# Patient Record
Sex: Male | Born: 1985 | Race: White | Hispanic: No | Marital: Single | Smoking: Former smoker
Health system: Southern US, Community
[De-identification: ages and names within clinical notes are randomized; demographics above are authoritative.]

## PROBLEM LIST (undated history)

## (undated) DIAGNOSIS — I10 Essential (primary) hypertension: Secondary | ICD-10-CM

## (undated) HISTORY — PX: HERNIA REPAIR: SHX51

## (undated) HISTORY — PX: SMALL INTESTINE SURGERY: SHX150

---

## 2005-04-23 ENCOUNTER — Ambulatory Visit: Payer: Self-pay | Admitting: Family Medicine

## 2013-07-06 ENCOUNTER — Telehealth: Payer: Self-pay | Admitting: Internal Medicine

## 2013-07-06 NOTE — Telephone Encounter (Signed)
error 

## 2013-07-25 ENCOUNTER — Ambulatory Visit (INDEPENDENT_AMBULATORY_CARE_PROVIDER_SITE_OTHER): Payer: 59 | Admitting: Internal Medicine

## 2013-07-25 ENCOUNTER — Encounter (INDEPENDENT_AMBULATORY_CARE_PROVIDER_SITE_OTHER): Payer: Self-pay

## 2013-07-25 ENCOUNTER — Encounter: Payer: Self-pay | Admitting: Internal Medicine

## 2013-07-25 VITALS — BP 110/80 | HR 86 | Temp 98.5°F | Ht 66.0 in | Wt 187.0 lb

## 2013-07-25 DIAGNOSIS — Z9889 Other specified postprocedural states: Secondary | ICD-10-CM

## 2013-07-25 DIAGNOSIS — E559 Vitamin D deficiency, unspecified: Secondary | ICD-10-CM

## 2013-07-25 DIAGNOSIS — E785 Hyperlipidemia, unspecified: Secondary | ICD-10-CM

## 2013-07-25 DIAGNOSIS — F900 Attention-deficit hyperactivity disorder, predominantly inattentive type: Secondary | ICD-10-CM

## 2013-07-25 DIAGNOSIS — R5381 Other malaise: Secondary | ICD-10-CM

## 2013-07-25 DIAGNOSIS — F411 Generalized anxiety disorder: Secondary | ICD-10-CM

## 2013-07-25 DIAGNOSIS — E669 Obesity, unspecified: Secondary | ICD-10-CM

## 2013-07-25 DIAGNOSIS — F988 Other specified behavioral and emotional disorders with onset usually occurring in childhood and adolescence: Secondary | ICD-10-CM
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
## 2013-07-26 ENCOUNTER — Other Ambulatory Visit (INDEPENDENT_AMBULATORY_CARE_PROVIDER_SITE_OTHER): Payer: 59

## 2013-07-26 DIAGNOSIS — R5381 Other malaise: Secondary | ICD-10-CM

## 2013-07-26 DIAGNOSIS — E559 Vitamin D deficiency, unspecified: Secondary | ICD-10-CM

## 2013-07-26 DIAGNOSIS — Z9889 Other specified postprocedural states: Secondary | ICD-10-CM

## 2013-07-26 DIAGNOSIS — F411 Generalized anxiety disorder: Secondary | ICD-10-CM | POA: Insufficient documentation

## 2013-07-26 DIAGNOSIS — E66811 Obesity, class 1: Secondary | ICD-10-CM | POA: Insufficient documentation

## 2013-07-26 DIAGNOSIS — F900 Attention-deficit hyperactivity disorder, predominantly inattentive type: Secondary | ICD-10-CM | POA: Insufficient documentation

## 2013-07-26 DIAGNOSIS — E669 Obesity, unspecified: Secondary | ICD-10-CM | POA: Insufficient documentation

## 2013-07-26 DIAGNOSIS — E785 Hyperlipidemia, unspecified: Secondary | ICD-10-CM

## 2013-07-26 LAB — LIPID PANEL
Cholesterol: 212 mg/dL — ABNORMAL HIGH (ref 0–200)
HDL: 45.1 mg/dL (ref >39.00)
Total CHOL/HDL Ratio: 5
VLDL: 44.4 mg/dL — ABNORMAL HIGH (ref 0.0–40.0)

## 2013-07-26 LAB — CBC WITH DIFFERENTIAL/PLATELET
Basophils Relative: 0.8 % (ref 0.0–3.0)
Eosinophils Absolute: 0.1 10*3/uL (ref 0.0–0.7)
Eosinophils Relative: 1.8 % (ref 0.0–5.0)
HCT: 42.6 % (ref 39.0–52.0)
Hemoglobin: 14.8 g/dL (ref 13.0–17.0)
Lymphs Abs: 2.1 10*3/uL (ref 0.7–4.0)
MCHC: 34.7 g/dL (ref 30.0–36.0)
MCV: 89.4 fl (ref 78.0–100.0)
Monocytes Absolute: 0.8 10*3/uL (ref 0.1–1.0)
Monocytes Relative: 10.6 % (ref 3.0–12.0)
Neutro Abs: 4.8 10*3/uL (ref 1.4–7.7)
Platelets: 291 10*3/uL (ref 150.0–400.0)
WBC: 8 10*3/uL (ref 4.5–10.5)

## 2013-07-26 LAB — VITAMIN B12: Vitamin B-12: 500 pg/mL (ref 211–911)

## 2013-07-26 LAB — COMPREHENSIVE METABOLIC PANEL
AST: 37 U/L (ref 0–37)
Albumin: 4.7 g/dL (ref 3.5–5.2)
Alkaline Phosphatase: 72 U/L (ref 39–117)
Creatinine, Ser: 1 mg/dL (ref 0.4–1.5)
GFR: 97.15 mL/min (ref >60.00)
Potassium: 4.7 mEq/L (ref 3.5–5.1)
Sodium: 141 mEq/L (ref 135–145)
Total Bilirubin: 0.9 mg/dL (ref 0.3–1.2)
Total Protein: 7.9 g/dL (ref 6.0–8.3)

## 2013-07-26 LAB — LDL CHOLESTEROL, DIRECT: Direct LDL: 148 mg/dL

## 2013-07-26 NOTE — Assessment & Plan Note (Signed)
Managed with prozac and clonazepan.

## 2013-07-26 NOTE — Assessment & Plan Note (Signed)
Managed with adderall long acting and shot acting.  He is functioning well on current therapy. No changes today

## 2013-07-26 NOTE — Assessment & Plan Note (Signed)
I have addressed  BMI and recommended a low glycemic index diet utilizing smaller more frequent meals to increase metabolism.  I have also recommended that patient start exercising with a goal of 30 minutes of aerobic exercise a minimum of 5 days per week. Screening for lipid disorders, thyroid and diabetes to be done   

## 2013-08-30 ENCOUNTER — Ambulatory Visit: Payer: 59 | Admitting: Internal Medicine

## 2013-09-25 ENCOUNTER — Encounter: Payer: Self-pay | Admitting: Internal Medicine

## 2013-09-25 ENCOUNTER — Ambulatory Visit (INDEPENDENT_AMBULATORY_CARE_PROVIDER_SITE_OTHER): Payer: 59 | Admitting: Internal Medicine

## 2013-09-25 VITALS — BP 118/78 | Temp 98.8°F | Wt 193.2 lb

## 2013-09-25 DIAGNOSIS — F411 Generalized anxiety disorder: Secondary | ICD-10-CM

## 2013-09-25 DIAGNOSIS — E669 Obesity, unspecified: Secondary | ICD-10-CM

## 2013-09-25 DIAGNOSIS — F988 Other specified behavioral and emotional disorders with onset usually occurring in childhood and adolescence: Secondary | ICD-10-CM

## 2013-09-25 DIAGNOSIS — Z8719 Personal history of other diseases of the digestive system: Secondary | ICD-10-CM

## 2013-09-25 DIAGNOSIS — F900 Attention-deficit hyperactivity disorder, predominantly inattentive type: Secondary | ICD-10-CM

## 2013-09-25 MED ORDER — AMPHETAMINE-DEXTROAMPHET ER 20 MG PO CP24: 20.0000 mg | ORAL_CAPSULE | Freq: Every day | ORAL | Status: DC

## 2013-09-25 MED ORDER — AMPHETAMINE-DEXTROAMPHETAMINE 10 MG PO TABS: 10.0000 mg | ORAL_TABLET | Freq: Two times a day (BID) | ORAL | Status: DC

## 2013-09-25 NOTE — Progress Notes (Signed)
Pre-visit discussion using our clinic review tool. No additional management support is needed unless otherwise documented below in the visit note.  
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
## 2013-09-26 DIAGNOSIS — Z8719 Personal history of other diseases of the digestive system: Secondary | ICD-10-CM | POA: Insufficient documentation

## 2013-09-26 NOTE — Assessment & Plan Note (Signed)
Well controlled on current regimen, no changes today. 

## 2013-09-26 NOTE — Assessment & Plan Note (Signed)
I have addressed  BMI and recommended wt loss of 10% of body weight over the next 6 months using a low glycemic index diet and regular exercise a minimum of 5 days per week.   

## 2013-09-26 NOTE — Assessment & Plan Note (Addendum)
Well controlled on current regimen. Liver and kidney Renal function stable, no changes today. Refills for 3 months given today.
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
## 2014-01-01 ENCOUNTER — Encounter: Payer: Self-pay | Admitting: Internal Medicine

## 2014-01-01 ENCOUNTER — Ambulatory Visit (INDEPENDENT_AMBULATORY_CARE_PROVIDER_SITE_OTHER): Payer: 59 | Admitting: Internal Medicine

## 2014-01-01 VITALS — BP 108/70 | HR 86 | Temp 98.4°F | Resp 16 | Wt 186.5 lb

## 2014-01-01 DIAGNOSIS — F988 Other specified behavioral and emotional disorders with onset usually occurring in childhood and adolescence: Secondary | ICD-10-CM

## 2014-01-01 DIAGNOSIS — F411 Generalized anxiety disorder: Secondary | ICD-10-CM

## 2014-01-01 DIAGNOSIS — E785 Hyperlipidemia, unspecified: Secondary | ICD-10-CM

## 2014-01-01 DIAGNOSIS — Z79899 Other long term (current) drug therapy: Secondary | ICD-10-CM

## 2014-01-01 DIAGNOSIS — E782 Mixed hyperlipidemia: Secondary | ICD-10-CM

## 2014-01-01 DIAGNOSIS — E669 Obesity, unspecified: Secondary | ICD-10-CM

## 2014-01-01 DIAGNOSIS — F900 Attention-deficit hyperactivity disorder, predominantly inattentive type: Secondary | ICD-10-CM

## 2014-01-01 MED ORDER — AMPHETAMINE-DEXTROAMPHET ER 20 MG PO CP24: 20.0000 mg | ORAL_CAPSULE | Freq: Every day | ORAL | Status: DC

## 2014-01-01 MED ORDER — AMPHETAMINE-DEXTROAMPHETAMINE 10 MG PO TABS: 10.0000 mg | ORAL_TABLET | Freq: Two times a day (BID) | ORAL | Status: DC

## 2014-01-01 NOTE — Progress Notes (Signed)
Pre-visit discussion using our clinic review tool. No additional management support is needed unless otherwise documented below in the visit note.  

## 2014-01-01 NOTE — Patient Instructions (Signed)
Congratulations on the weight loss  Return in 6 months for follow up

## 2014-01-01 NOTE — Progress Notes (Signed)
Patient ID: Christopher Terrell, male   DOB: 1986-03-25, 28 y.o.   MRN: [id number]  Patient Active Problem List   Diagnosis Date Noted  . Elevated triglycerides with high cholesterol 01/02/2014  . History of hiatal hernia 09/26/2013  . ADD (attention deficit hyperactivity disorder, inattentive type) 07/26/2013  . Generalized anxiety disorder 07/26/2013  . Obesity, unspecified 07/26/2013    Subjective:  CC:   Chief Complaint  Patient presents with  . Follow-up    medications    HPI:   Christopher Terrell is a 28 y.o. male who presents for 3 month Follow up on ADD,  Overweight,  Hypertriglyceridemia.   He has  lost 7 lbs,  By paying more attention to diet.  Some occasional exercise but not regular.    ADD well controlled on Adderal 20 and 10 daily .  NO escalation of meds,  Not combining with alcohol.  Performing well at work.   Uses prn clonazepam ODT sparingly for night terros brought on by major lie changes.  Occurring 3/year     No past medical history on file.  Past Surgical History  Procedure Laterality Date  . Small intestine surgery    . Hernia repair         The following portions of the patient's history were reviewed and updated as appropriate: Allergies, current medications, and problem list.    Review of Systems:   Patient denies headache, fevers, malaise, unintentional weight loss, skin rash, eye pain, sinus congestion and sinus pain, sore throat, dysphagia,  hemoptysis , cough, dyspnea, wheezing, chest pain, palpitations, orthopnea, edema, abdominal pain, nausea, melena, diarrhea, constipation, flank pain, dysuria, hematuria, urinary  Frequency, nocturia, numbness, tingling, seizures,  Focal weakness, Loss of consciousness,  Tremor, insomnia, depression, anxiety, and suicidal ideation.     History   Social History  . Marital Status: Single    Spouse Name: N/A    Number of Children: N/A  . Years of Education: N/A   Occupational History  . Not on file.    Social History Main Topics  . Smoking status: Former Smoker    Quit date: 09/22/2011  . Smokeless tobacco: Never Used  . Alcohol Use: Yes  . Drug Use: No  . Sexual Activity: Not on file   Other Topics Concern  . Not on file   Social History Narrative  . No narrative on file    Objective:  Filed Vitals:   01/01/14 1703  BP: 108/70  Pulse: 86  Temp: 98.4 F (36.9 C)  Resp: 16     General appearance: alert, cooperative and appears stated age Ears: normal TM's and external ear canals both ears Throat: lips, mucosa, and tongue normal; teeth and gums normal Neck: no adenopathy, no carotid bruit, supple, symmetrical, trachea midline and thyroid not enlarged, symmetric, no tenderness/mass/nodules Back: symmetric, no curvature. ROM normal. No CVA tenderness. Lungs: clear to auscultation bilaterally Heart: regular rate and rhythm, S1, S2 normal, no murmur, click, rub or gallop Abdomen: soft, non-tender; bowel sounds normal; no masses,  no organomegaly Pulses: 2+ and symmetric Skin: Skin color, texture, turgor normal. No rashes or lesions Lymph nodes: Cervical, supraclavicular, and axillary nodes normal.  Assessment and Plan:  ADD (attention deficit hyperactivity disorder, inattentive type) Well controlled on current regimen. Liver and kidney Renal function stable, no changes today. Refills for 3 months given today.     Generalized anxiety disorder Well controlled on current regimen. , no changes today.    Obesity, unspecified I have congratulated him  in reduction of   BMI and encouraged  Continued weight loss with goal of 10% of body weight over the next 6 months using a low glycemic index diet and regular exercise a minimum of 5 days per week.     Updated Medication List Outpatient Encounter Prescriptions as of 01/01/2014  Medication Sig  . amphetamine-dextroamphetamine (ADDERALL XR) 20 MG 24 hr capsule Take 1 capsule (20 mg total) by mouth daily.  Marland Kitchen  amphetamine-dextroamphetamine (ADDERALL XR) 20 MG 24 hr capsule Take 1 capsule (20 mg total) by mouth daily.  Marland Kitchen amphetamine-dextroamphetamine (ADDERALL) 10 MG tablet Take 1 tablet (10 mg total) by mouth 2 (two) times daily with a meal.  . clonazePAM (KLONOPIN) 0.25 MG disintegrating tablet Take 0.25 mg by mouth daily.  . propranolol (INDERAL) 10 MG tablet Take 0.5 mg by mouth 3 (three) times daily as needed.  . [DISCONTINUED] amphetamine-dextroamphetamine (ADDERALL XR) 20 MG 24 hr capsule Take 1 capsule (20 mg total) by mouth daily.  . [DISCONTINUED] amphetamine-dextroamphetamine (ADDERALL XR) 20 MG 24 hr capsule Take 1 capsule (20 mg total) by mouth daily.  . [DISCONTINUED] amphetamine-dextroamphetamine (ADDERALL XR) 20 MG 24 hr capsule Take 1 capsule (20 mg total) by mouth daily.  . [DISCONTINUED] amphetamine-dextroamphetamine (ADDERALL) 10 MG tablet Take 1 tablet (10 mg total) by mouth 2 (two) times daily with a meal.  . [DISCONTINUED] amphetamine-dextroamphetamine (ADDERALL) 10 MG tablet Take 1 tablet (10 mg total) by mouth 2 (two) times daily with a meal.  . [DISCONTINUED] amphetamine-dextroamphetamine (ADDERALL) 10 MG tablet Take 1 tablet (10 mg total) by mouth 2 (two) times daily with a meal.  . [DISCONTINUED] FLUoxetine (PROZAC) 10 MG capsule Take 10 mg by mouth 3 (three) times daily as needed (anxiety).     Orders Placed This Encounter  Procedures  . Comprehensive metabolic panel  . Lipid panel    No Follow-up on file.

## 2014-01-02 DIAGNOSIS — E782 Mixed hyperlipidemia: Secondary | ICD-10-CM | POA: Insufficient documentation

## 2014-01-02 NOTE — Assessment & Plan Note (Signed)
I have congratulated him in reduction of   BMI and encouraged  Continued weight loss with goal of 10% of body weight over the next 6 months using a low glycemic index diet and regular exercise a minimum of 5 days per week.

## 2014-01-02 NOTE — Assessment & Plan Note (Signed)
Well controlled on current regimen. Liver and kidney Renal function stable, no changes today. Refills for 3 months given today.

## 2014-01-02 NOTE — Assessment & Plan Note (Signed)
Well controlled on current regimen, no changes today. 

## 2014-03-29 ENCOUNTER — Telehealth: Payer: Self-pay | Admitting: Internal Medicine

## 2014-03-29 DIAGNOSIS — F900 Attention-deficit hyperactivity disorder, predominantly inattentive type: Secondary | ICD-10-CM

## 2014-03-29 MED ORDER — AMPHETAMINE-DEXTROAMPHET ER 20 MG PO CP24: 20.0000 mg | ORAL_CAPSULE | Freq: Every day | ORAL | Status: DC

## 2014-03-29 MED ORDER — AMPHETAMINE-DEXTROAMPHETAMINE 10 MG PO TABS: 10.0000 mg | ORAL_TABLET | Freq: Two times a day (BID) | ORAL | Status: DC

## 2014-03-29 NOTE — Telephone Encounter (Signed)
Patient called for refill on adderall last OV 4/15 please advise.

## 2014-03-29 NOTE — Assessment & Plan Note (Addendum)
Patient will be seen every 6 months for refills.

## 2014-03-29 NOTE — Telephone Encounter (Signed)
Patient called back and stated he needs to have labs done at [facility] .

## 2014-03-29 NOTE — Telephone Encounter (Signed)
Patient coming in for fasting labs. 03/30/14 and Narcotic agreement printed to be signed .

## 2014-03-29 NOTE — Telephone Encounter (Signed)
Needs to sign controlled substance contract.  And needs fastign labs repeated .  Refills not due til July 13 so pls schedule him and appt for  fasting labs visit .  Needs 6 month follow up in Nov

## 2014-03-30 ENCOUNTER — Other Ambulatory Visit: Payer: 59

## 2014-04-02 ENCOUNTER — Other Ambulatory Visit: Payer: Self-pay | Admitting: Internal Medicine

## 2014-04-02 ENCOUNTER — Telehealth: Payer: Self-pay | Admitting: Internal Medicine

## 2014-04-02 MED ORDER — AMPHETAMINE-DEXTROAMPHETAMINE 10 MG PO TABS: 10.0000 mg | ORAL_TABLET | Freq: Two times a day (BID) | ORAL | Status: DC

## 2014-04-02 MED ORDER — AMPHETAMINE-DEXTROAMPHET ER 20 MG PO CP24: 20.0000 mg | ORAL_CAPSULE | Freq: Every day | ORAL | Status: DC

## 2014-04-02 NOTE — Telephone Encounter (Signed)
Patient came in to sign narcotic agreement and pick up lab order and scripts, realized patient needs script for July on Adderall XR, and for August and September on the adderall 10 mg, please advise.

## 2014-04-02 NOTE — Telephone Encounter (Signed)
ok 

## 2014-04-03 NOTE — Telephone Encounter (Signed)
Patient notified scripts ready.

## 2014-04-27 ENCOUNTER — Other Ambulatory Visit: Payer: Self-pay | Admitting: Internal Medicine

## 2014-04-27 LAB — BASIC METABOLIC PANEL
BUN: 19 mg/dL (ref 4–21)
CREATININE: 1.1 mg/dL (ref 0.6–1.3)
Glucose: 102 mg/dL
Potassium: 4.1 mmol/L (ref 3.4–5.3)
Sodium: 138 mmol/L (ref 137–147)

## 2014-04-27 LAB — COMPREHENSIVE METABOLIC PANEL
AST: 25 U/L (ref 15–37)
Albumin: 3.6 g/dL (ref 3.4–5.0)
Alkaline Phosphatase: 78 U/L
Anion Gap: 8 (ref 7–16)
BILIRUBIN TOTAL: 0.6 mg/dL (ref 0.2–1.0)
BUN: 19 mg/dL — ABNORMAL HIGH (ref 7–18)
CALCIUM: 8.7 mg/dL (ref 8.5–10.1)
CREATININE: 1.12 mg/dL (ref 0.60–1.30)
Chloride: 104 mmol/L (ref 98–107)
Co2: 26 mmol/L (ref 21–32)
EGFR (African American): >60
EGFR (Non-African Amer.): >60
GLUCOSE: 102 mg/dL — AB (ref 65–99)
Osmolality: 278 (ref 275–301)
Potassium: 4.1 mmol/L (ref 3.5–5.1)
SGPT (ALT): 40 U/L
SODIUM: 138 mmol/L (ref 136–145)
TOTAL PROTEIN: 7.1 g/dL (ref 6.4–8.2)

## 2014-04-27 LAB — LIPID PANEL
CHOLESTEROL: 182 mg/dL (ref 0–200)
Cholesterol: 182 mg/dL (ref 0–200)
HDL Cholesterol: 38 mg/dL — ABNORMAL LOW (ref 40–60)
HDL: 38 mg/dL (ref 35–70)
LDL CALC: 116 mg/dL
Ldl Cholesterol, Calc: 116 mg/dL — ABNORMAL HIGH (ref 0–100)
Triglycerides: 141 mg/dL (ref 0–200)
Triglycerides: 141 mg/dL (ref 40–160)
VLDL CHOLESTEROL, CALC: 28 mg/dL (ref 5–40)

## 2014-04-27 LAB — HEPATIC FUNCTION PANEL
ALK PHOS: 78 U/L (ref 25–125)
ALT: 40 U/L (ref 10–40)
AST: 25 U/L (ref 14–40)
Bilirubin, Total: 0.6 mg/dL

## 2014-04-30 ENCOUNTER — Encounter: Payer: Self-pay | Admitting: Internal Medicine

## 2014-05-02 ENCOUNTER — Encounter: Payer: Self-pay | Admitting: Internal Medicine

## 2014-05-03 ENCOUNTER — Telehealth: Payer: Self-pay | Admitting: Internal Medicine

## 2014-05-03 NOTE — Telephone Encounter (Signed)
Your cholesterol, liver and kidney function are normal.  You do not need any medication changes. Please plan to repeat the labs in 6 months.    Regards,   Dr. Vandora Jaskulski  

## 2014-05-04 NOTE — Telephone Encounter (Signed)
Left detailed message on pts identified VM 

## 2014-05-11 ENCOUNTER — Encounter: Payer: Self-pay | Admitting: Internal Medicine

## 2014-07-02 ENCOUNTER — Encounter: Payer: Self-pay | Admitting: Internal Medicine

## 2014-07-02 ENCOUNTER — Ambulatory Visit (INDEPENDENT_AMBULATORY_CARE_PROVIDER_SITE_OTHER): Payer: 59 | Admitting: Internal Medicine

## 2014-07-02 VITALS — BP 134/92 | HR 81 | Temp 98.8°F | Resp 16 | Ht 66.0 in | Wt 192.8 lb

## 2014-07-02 DIAGNOSIS — F900 Attention-deficit hyperactivity disorder, predominantly inattentive type: Secondary | ICD-10-CM

## 2014-07-02 DIAGNOSIS — R030 Elevated blood-pressure reading, without diagnosis of hypertension: Secondary | ICD-10-CM

## 2014-07-02 DIAGNOSIS — F411 Generalized anxiety disorder: Secondary | ICD-10-CM

## 2014-07-02 DIAGNOSIS — E669 Obesity, unspecified: Secondary | ICD-10-CM

## 2014-07-02 DIAGNOSIS — E782 Mixed hyperlipidemia: Secondary | ICD-10-CM

## 2014-07-02 MED ORDER — AMPHETAMINE-DEXTROAMPHETAMINE 10 MG PO TABS
10.0000 mg | ORAL_TABLET | Freq: Two times a day (BID) | ORAL | Status: DC
Start: 2014-07-02 — End: 2014-07-02

## 2014-07-02 MED ORDER — CLONAZEPAM 0.25 MG PO TBDP: 0.2500 mg | ORAL_TABLET | Freq: Every day | ORAL | Status: DC | PRN

## 2014-07-02 MED ORDER — AMPHETAMINE-DEXTROAMPHET ER 20 MG PO CP24: 20.0000 mg | ORAL_CAPSULE | Freq: Every day | ORAL | Status: DC

## 2014-07-02 MED ORDER — AMPHETAMINE-DEXTROAMPHETAMINE 10 MG PO TABS: 10.0000 mg | ORAL_TABLET | Freq: Two times a day (BID) | ORAL | Status: DC

## 2014-07-02 MED ORDER — PROPRANOLOL HCL ER 60 MG PO CP24: 60.0000 mg | ORAL_CAPSULE | Freq: Every day | ORAL | Status: DC

## 2014-07-02 MED ORDER — AMPHETAMINE-DEXTROAMPHET ER 20 MG PO CP24
20.0000 mg | ORAL_CAPSULE | Freq: Every day | ORAL | Status: DC
Start: 2014-07-02 — End: 2014-07-02

## 2014-07-02 NOTE — Progress Notes (Signed)
Patient ID: Christopher Terrell, male   DOB: 02/12/1986, 28 y.o.   MRN: [id number]  Patient Active Problem List   Diagnosis Date Noted  . Elevated blood pressure reading without diagnosis of hypertension 07/03/2014  . Elevated triglycerides with high cholesterol 01/02/2014  . History of hiatal hernia 09/26/2013  . ADD (attention deficit hyperactivity disorder, inattentive type) 07/26/2013  . Generalized anxiety disorder 07/26/2013  . Obesity 07/26/2013    Subjective:  CC:   Chief Complaint  Patient presents with  . Follow-up    medication refilled    HPI:   Christopher Terrell is a 28 y.o. male who presents for  6 month follow up on ADD, GAD, hypertriglyceridemia, and obesity. He is functioning well on current regimen of Adderlal XR 20 mg and short acting 10 mg in afternoon .  Using the clonazepam sparingly.  No panic attacks.  Repeat lipids were normal, achieved with dietary modifications and exercise.  Weight has increased but his clothes are fitting better than before and he is physically active with some form of exercise 3-4 days per week.    History reviewed. No pertinent past medical history.  Past Surgical History  Procedure Laterality Date  . Small intestine surgery    . Hernia repair         The following portions of the patient's history were reviewed and updated as appropriate: Allergies, current medications, and problem list.    Review of Systems:   Patient denies headache, fevers, malaise, unintentional weight loss, skin rash, eye pain, sinus congestion and sinus pain, sore throat, dysphagia,  hemoptysis , cough, dyspnea, wheezing, chest pain, palpitations, orthopnea, edema, abdominal pain, nausea, melena, diarrhea, constipation, flank pain, dysuria, hematuria, urinary  Frequency, nocturia, numbness, tingling, seizures,  Focal weakness, Loss of consciousness,  Tremor, insomnia, depression, anxiety, and suicidal ideation.     History   Social History  . Marital  Status: Single    Spouse Name: N/A    Number of Children: N/A  . Years of Education: N/A   Occupational History  . Not on file.   Social History Main Topics  . Smoking status: Former Smoker    Quit date: 09/22/2011  . Smokeless tobacco: Never Used  . Alcohol Use: 4.2 oz/week    7 Cans of beer per week  . Drug Use: No  . Sexual Activity: Not on file   Other Topics Concern  . Not on file   Social History Narrative  . No narrative on file    Objective:  Filed Vitals:   07/02/14 1758  BP: 134/92  Pulse: 81  Temp: 98.8 F (37.1 C)  Resp: 16     General appearance: alert, cooperative and appears stated age Ears: normal TM's and external ear canals both ears Throat: lips, mucosa, and tongue normal; teeth and gums normal Neck: no adenopathy, no carotid bruit, supple, symmetrical, trachea midline and thyroid not enlarged, symmetric, no tenderness/mass/nodules Back: symmetric, no curvature. ROM normal. No CVA tenderness. Lungs: clear to auscultation bilaterally Heart: regular rate and rhythm, S1, S2 normal, no murmur, click, rub or gallop Abdomen: soft, non-tender; bowel sounds normal; no masses,  no organomegaly Pulses: 2+ and symmetric Skin: Skin color, texture, turgor normal. No rashes or lesions Lymph nodes: Cervical, supraclavicular, and axillary nodes normal.  Assessment and Plan:  Elevated triglycerides with high cholesterol Resolved with low GI diet and exercise,  Will repeat annually  Lab Results  Component Value Date   CHOL 182 04/27/2014   HDL  38 04/27/2014   LDLCALC 116 04/27/2014   LDLDIRECT 148.0 07/26/2013   TRIG 141 04/27/2014   CHOLHDL 5 07/26/2013    Obesity I have addressed  BMI and recommended wt loss of 10% of body weigh over the next 6 months using a low glycemic index diet and regular exercise a minimum of 5 days per week.    Generalized anxiety disorder Well controlled on current regimen. , no changes today.      ADD (attention deficit  hyperactivity disorder, inattentive type) Performing well on current regimen of Adderall XR 20 mg qam and adderall 10 mg in afternoon  Elevated blood pressure reading without diagnosis of hypertension Secondary to adderall.  Changing propranolol to LA 60 mg daily    Updated Medication List Outpatient Encounter Prescriptions as of 07/02/2014  Medication Sig  . amphetamine-dextroamphetamine (ADDERALL XR) 20 MG 24 hr capsule Take 1 capsule (20 mg total) by mouth daily.  Marland Kitchen amphetamine-dextroamphetamine (ADDERALL) 10 MG tablet Take 1 tablet (10 mg total) by mouth 2 (two) times daily with a meal.  . clonazePAM (KLONOPIN) 0.25 MG disintegrating tablet Take 1 tablet (0.25 mg total) by mouth daily as needed.  . [DISCONTINUED] amphetamine-dextroamphetamine (ADDERALL XR) 20 MG 24 hr capsule Take 1 capsule (20 mg total) by mouth daily.  . [DISCONTINUED] amphetamine-dextroamphetamine (ADDERALL XR) 20 MG 24 hr capsule Take 1 capsule (20 mg total) by mouth daily.  . [DISCONTINUED] amphetamine-dextroamphetamine (ADDERALL) 10 MG tablet Take 1 tablet (10 mg total) by mouth 2 (two) times daily with a meal.  . [DISCONTINUED] amphetamine-dextroamphetamine (ADDERALL) 10 MG tablet Take 1 tablet (10 mg total) by mouth 2 (two) times daily with a meal.  . [DISCONTINUED] amphetamine-dextroamphetamine (ADDERALL) 10 MG tablet Take 1 tablet (10 mg total) by mouth 2 (two) times daily with a meal.  . [DISCONTINUED] clonazePAM (KLONOPIN) 0.25 MG disintegrating tablet Take 0.25 mg by mouth daily as needed.   . [DISCONTINUED] propranolol (INDERAL) 10 MG tablet Take 5 mg by mouth 3 (three) times daily as needed.   Marland Kitchen amphetamine-dextroamphetamine (ADDERALL XR) 20 MG 24 hr capsule Take 1 capsule (20 mg total) by mouth daily.  . propranolol ER (INDERAL LA) 60 MG 24 hr capsule Take 1 capsule (60 mg total) by mouth daily.  . [DISCONTINUED] amphetamine-dextroamphetamine (ADDERALL XR) 20 MG 24 hr capsule Take 1 capsule (20 mg total) by  mouth daily.     No orders of the defined types were placed in this encounter.    No Follow-up on file.

## 2014-07-02 NOTE — Patient Instructions (Signed)
This is  my version of a  "Low GI"  Diet:  It will still lower your blood sugars and allow you to lose 4 to 8  lbs  per month if you follow it carefully.  Your goal with exercise is a minimum of 30 minutes of aerobic exercise 5 days per week (Walking does not count once it becomes easy!)     All of the foods can be found at grocery stores and in bulk at [employer].  The Atkins protein bars and shakes are available in more varieties at Target, WalMart and [employer]'s [employer].     7 AM Breakfast:  Choose from the following:  Low carbohydrate Protein  Shakes (I recommend the EAS AdvantEdge "Carb Control" shakes  Or the low carb shakes by Atkins.    2.5 carbs   Arnold's "Sandwhich Thin"toasted  w/ peanut butter (no jelly: about 20 net carbs  "Bagel Thin" with cream cheese and salmon: about 20 carbs   a scrambled egg/bacon/cheese burrito made with Mission's "carb balance" whole wheat tortilla  (about 10 net carbs )  A slice of home made fritatta (egg based dish without a crust:  google it)    Avoid cereal and bananas, oatmeal and cream of wheat and grits. They are loaded with carbohydrates!   10 AM: high protein snack  Protein bar by Atkins (the snack size, under 200 cal, usually < 6 net carbs).    A stick of cheese:  Around 1 carb,  100 cal     Dannon Light n Fit Greek Yogurt  (80 cal, 8 carbs)  Other so called "protein bars" and Greek yogurts tend to be loaded with carbohydrates.  Remember, in food advertising, the word "energy" is synonymous for " carbohydrate."  Lunch:   A Sandwich using the bread choices listed, Can use any  Eggs,  lunchmeat, grilled meat or canned tuna), avocado, regular mayo/mustard  and cheese.  A Salad using blue cheese, ranch,  Goddess or vinagrette,  No croutons or "confetti" and no "candied nuts" but regular nuts OK.   No pretzels or chips.  Pickles and miniature sweet peppers are a good low carb alternative that provide a "crunch"  The bread is the only source of  carbohydrate in a sandwich and  can be decreased by trying some of these alternatives to traditional loaf bread  Joseph's makes a pita bread and a flat bread that are 50 cal and 4 net carbs available at BJs and WalMart.  This can be toasted to use with hummous as well  Toufayan makes a low carb flatbread that's 100 cal and 9 net carbs available at [employer] and [employer]  Mission makes 2 sizes of  Low carb whole wheat tortilla  (The large one is 210 cal and 6 net carbs)  Flat Out makes flatbreads that are low carb as well  Avoid "Low fat dressings, as well as [place] and [place] dressings They are loaded with sugar!   3 PM/ Mid day  Snack:  Consider  1 ounce of  almonds, walnuts, pistachios, pecans, peanuts,  Macadamia nuts or a nut medley.  Avoid "granola"; the dried cranberries and raisins are loaded with carbohydrates. Mixed nuts as long as there are no raisins,  cranberries or dried fruit.    Try the prosciutto/mozzarella cheese sticks by Fiorruci  In deli /backery section   High protein   To avoid overindulging in snacks: Try drinking a glass of unsweeted almond/coconut milk    Or a cup of coffee with your Atkins chocolate bar to keep you from having 3!!!   Pork rinds!  Yes Pork Rinds        6 PM  Dinner:     Meat/fowl/fish with a green salad, and either broccoli, cauliflower, green beans, spinach, brussel sprouts or  Lima beans. DO NOT BREAD THE PROTEIN!!      There is a low carb pasta by Dreamfield's that is acceptable and tastes great: only 5 digestible carbs/serving.( All grocery stores but BJs carry it )  Try Michel Angelo's chicken piccata or chicken or eggplant parm over low carb pasta.(Lowes and BJs)   Aaron Sanchez's "Carnitas" (pulled pork, no sauce,  0 carbs) or his beef pot roast to make a dinner burrito (at BJ's)  Pesto over low carb pasta (bj's sells a good quality pesto in the center refrigerated section of the deli   Try satueeing  Bok Choy with mushroooms  Whole  wheat pasta is still full of digestible carbs and  Not as low in glycemic index as Dreamfield's.   Brown rice is still rice,  So skip the rice and noodles if you eat Chinese or Thai (or at least limit to 1/2 cup)  9 PM snack :   Breyer's "low carb" fudgsicle or  ice cream bar (Carb Smart line), or  Weight Watcher's ice cream bar , or another "no sugar added" ice cream;  a serving of fresh berries/cherries with whipped cream   Cheese or DANNON'S LlGHT N FIT GREEK YOGURT or the Oikos greek yogurt   8 ounces of [place] unsweetened almond/cococunut milk    Avoid bananas, pineapple, grapes  and watermelon on a regular basis because they are high in sugar.  THINK OF THEM AS DESSERT  Remember that snack Substitutions should be less than 10 NET carbs per serving and meals < 20 carbs. Remember to subtract fiber grams to get the "net carbs." 

## 2014-07-02 NOTE — Progress Notes (Signed)
Pre-visit discussion using our clinic review tool. No additional management support is needed unless otherwise documented below in the visit note.  

## 2014-07-03 ENCOUNTER — Encounter: Payer: Self-pay | Admitting: Internal Medicine

## 2014-07-03 DIAGNOSIS — I10 Essential (primary) hypertension: Secondary | ICD-10-CM | POA: Insufficient documentation

## 2014-07-03 NOTE — Assessment & Plan Note (Signed)
Performing well on current regimen of Adderall XR 20 mg qam and adderall 10 mg in afternoon   

## 2014-07-03 NOTE — Assessment & Plan Note (Signed)
Secondary to adderall.  Changing propranolol to LA 60 mg daily

## 2014-07-03 NOTE — Assessment & Plan Note (Signed)
I have addressed  BMI and recommended wt loss of 10% of body weigh over the next 6 months using a low glycemic index diet and regular exercise a minimum of 5 days per week.   

## 2014-07-03 NOTE — Assessment & Plan Note (Signed)
Well controlled on current regimen, no changes today. 

## 2014-07-03 NOTE — Assessment & Plan Note (Signed)
Resolved with low GI diet and exercise,  Will repeat annually  Lab Results  Component Value Date   CHOL 182 04/27/2014   HDL 38 04/27/2014   LDLCALC 116 04/27/2014   LDLDIRECT 148.0 07/26/2013   TRIG 141 04/27/2014   CHOLHDL 5 07/26/2013

## 2014-12-03 ENCOUNTER — Other Ambulatory Visit: Payer: Self-pay | Admitting: Internal Medicine

## 2014-12-03 NOTE — Telephone Encounter (Signed)
Last visit 07/02/14. Has appt scheduled 12/31/14

## 2014-12-03 NOTE — Telephone Encounter (Signed)
amphetamine-dextroamphetamine (ADDERALL XR) 20 MG 24 hr

## 2014-12-04 MED ORDER — AMPHETAMINE-DEXTROAMPHET ER 20 MG PO CP24: 20.0000 mg | ORAL_CAPSULE | Freq: Every day | ORAL | Status: DC

## 2014-12-04 NOTE — Telephone Encounter (Signed)
Ok to refill,  printed rx  

## 2014-12-05 NOTE — Telephone Encounter (Signed)
Nitchia left msg for pt that Rx ready for pick up

## 2014-12-12 ENCOUNTER — Telehealth: Payer: Self-pay | Admitting: Internal Medicine

## 2014-12-12 MED ORDER — PROPRANOLOL HCL 10 MG PO TABS: 5.0000 mg | ORAL_TABLET | Freq: Three times a day (TID) | ORAL | Status: DC | PRN

## 2014-12-12 MED ORDER — AMPHETAMINE-DEXTROAMPHET ER 20 MG PO CP24
20.0000 mg | ORAL_CAPSULE | Freq: Every day | ORAL | Status: DC
Start: 2014-12-12 — End: 2015-01-11

## 2014-12-12 MED ORDER — AMPHETAMINE-DEXTROAMPHETAMINE 10 MG PO TABS: 10.0000 mg | ORAL_TABLET | Freq: Two times a day (BID) | ORAL | Status: DC

## 2014-12-12 NOTE — Telephone Encounter (Signed)
Ok to fill until OV

## 2014-12-12 NOTE — Telephone Encounter (Signed)
Left message on VM that Rx ready for pick up at the office

## 2014-12-12 NOTE — Telephone Encounter (Signed)
Ok to refill,  printed rx  

## 2014-12-12 NOTE — Telephone Encounter (Signed)
amphetamine-dextroamphetamine (ADDERALL) 10 MG tablet  propranolol ER (INDERAL LA) 60 MG 24 hr capsule The patient stated that this medication makes him feel funny . He wants to go back to taking Propranolol 10 mg tabs.

## 2014-12-31 ENCOUNTER — Encounter: Payer: 59 | Admitting: Internal Medicine

## 2015-01-11 ENCOUNTER — Ambulatory Visit (INDEPENDENT_AMBULATORY_CARE_PROVIDER_SITE_OTHER): Payer: 59 | Admitting: Internal Medicine

## 2015-01-11 ENCOUNTER — Encounter: Payer: Self-pay | Admitting: Internal Medicine

## 2015-01-11 VITALS — BP 118/78 | HR 80 | Temp 98.4°F | Resp 16 | Ht 66.25 in | Wt 187.0 lb

## 2015-01-11 DIAGNOSIS — Z0000 Encounter for general adult medical examination without abnormal findings: Secondary | ICD-10-CM | POA: Diagnosis not present

## 2015-01-11 DIAGNOSIS — R030 Elevated blood-pressure reading, without diagnosis of hypertension: Secondary | ICD-10-CM

## 2015-01-11 DIAGNOSIS — Z1159 Encounter for screening for other viral diseases: Secondary | ICD-10-CM | POA: Diagnosis not present

## 2015-01-11 DIAGNOSIS — F411 Generalized anxiety disorder: Secondary | ICD-10-CM

## 2015-01-11 DIAGNOSIS — E785 Hyperlipidemia, unspecified: Secondary | ICD-10-CM

## 2015-01-11 DIAGNOSIS — R5383 Other fatigue: Secondary | ICD-10-CM | POA: Diagnosis not present

## 2015-01-11 DIAGNOSIS — E782 Mixed hyperlipidemia: Secondary | ICD-10-CM

## 2015-01-11 DIAGNOSIS — F900 Attention-deficit hyperactivity disorder, predominantly inattentive type: Secondary | ICD-10-CM

## 2015-01-11 LAB — CBC WITH DIFFERENTIAL/PLATELET
BASOS ABS: 0.1 10*3/uL (ref 0.0–0.1)
Basophils Relative: 0.6 % (ref 0.0–3.0)
Eosinophils Absolute: 0.1 10*3/uL (ref 0.0–0.7)
Eosinophils Relative: 0.9 % (ref 0.0–5.0)
HEMATOCRIT: 41.9 % (ref 39.0–52.0)
HEMOGLOBIN: 14.4 g/dL (ref 13.0–17.0)
LYMPHS ABS: 2.4 10*3/uL (ref 0.7–4.0)
Lymphocytes Relative: 26.3 % (ref 12.0–46.0)
MCHC: 34.5 g/dL (ref 30.0–36.0)
MCV: 89 fl (ref 78.0–100.0)
MONO ABS: 1 10*3/uL (ref 0.1–1.0)
Monocytes Relative: 10.6 % (ref 3.0–12.0)
NEUTROS ABS: 5.7 10*3/uL (ref 1.4–7.7)
Neutrophils Relative %: 61.6 % (ref 43.0–77.0)
Platelets: 276 10*3/uL (ref 150.0–400.0)
RBC: 4.71 Mil/uL (ref 4.22–5.81)
RDW: 13 % (ref 11.5–15.5)
WBC: 9.2 10*3/uL (ref 4.0–10.5)

## 2015-01-11 LAB — COMPREHENSIVE METABOLIC PANEL
ALBUMIN: 4.4 g/dL (ref 3.5–5.2)
ALK PHOS: 73 U/L (ref 39–117)
ALT: 31 U/L (ref 0–53)
AST: 25 U/L (ref 0–37)
BUN: 17 mg/dL (ref 6–23)
CO2: 29 mEq/L (ref 19–32)
CREATININE: 1 mg/dL (ref 0.40–1.50)
Calcium: 9.4 mg/dL (ref 8.4–10.5)
Chloride: 101 mEq/L (ref 96–112)
GFR: 92.87 mL/min (ref >60.00)
GLUCOSE: 75 mg/dL (ref 70–99)
Potassium: 4 mEq/L (ref 3.5–5.1)
Sodium: 137 mEq/L (ref 135–145)
Total Bilirubin: 0.4 mg/dL (ref 0.2–1.2)
Total Protein: 7.2 g/dL (ref 6.0–8.3)

## 2015-01-11 LAB — LIPID PANEL
CHOLESTEROL: 203 mg/dL — AB (ref 0–200)
HDL: 43.6 mg/dL (ref >39.00)
LDL Cholesterol: 139 mg/dL — ABNORMAL HIGH (ref 0–99)
NonHDL: 159.4
Total CHOL/HDL Ratio: 5
Triglycerides: 100 mg/dL (ref 0.0–149.0)
VLDL: 20 mg/dL (ref 0.0–40.0)

## 2015-01-11 MED ORDER — AMPHETAMINE-DEXTROAMPHETAMINE 10 MG PO TABS: 10.0000 mg | ORAL_TABLET | Freq: Two times a day (BID) | ORAL | Status: DC

## 2015-01-11 MED ORDER — CLONAZEPAM 0.25 MG PO TBDP: 0.2500 mg | ORAL_TABLET | Freq: Every day | ORAL | Status: DC | PRN

## 2015-01-11 MED ORDER — AMPHETAMINE-DEXTROAMPHET ER 20 MG PO CP24: 20.0000 mg | ORAL_CAPSULE | Freq: Every day | ORAL | Status: DC

## 2015-01-11 NOTE — Patient Instructions (Signed)

## 2015-01-11 NOTE — Progress Notes (Signed)
Patient ID: Christopher Terrell, male   DOB: 09-20-1986, 29 y.o.   MRN: [id number]  The patient is here for his annual  physical examination and management of other chronic and acute problems.   The risk factors are reflected in the social history.  The roster of all physicians providing medical care to patient - is listed in the Snapshot section of the chart.  Activities of daily living:  The patient is 100% independent in all ADLs: dressing, toileting, feeding as well as independent mobility  Home safety : The patient has smoke detectors in the home. He wears seatbelts.  There are no firearms at home. There is no violence in the home.   There is no risks for hepatitis, STDs or HIV. There is no   history of blood transfusion. There is no travel history to infectious disease endemic areas of the world.  The patient has seen their dentist in the last six month and  their eye doctor in the last year.  They do not  have excessive sun exposure. They have seen a dermatoloigist in the last year. Discussed the need for sun protection: hats, long sleeves and use of sunscreen if there is significant sun exposure.   Diet: the importance of a healthy diet is discussed. They do have a healthy diet.  The benefits of regular aerobic exercise were discussed. He exercises a minimum of 30 minutes  5 days per week. Depression screen: there are no signs or vegative symptoms of depression- irritability, change in appetite, anhedonia, sadness/tearfullness.  The following portions of the patient's history were reviewed and updated as appropriate: allergies, current medications, past family history, past medical history,  past surgical history, past social history  and problem list.  Visual acuity was not assessed per patient preference since he has regular follow up with his ophthalmologist. Hearing and body mass index were assessed and reviewed.   During the course of the visit the patient was educated and counseled  about appropriate screening and preventive services including :  nutrition counseling, colorectal cancer screening, and recommended immunizations.    Review of Systems:  Patient denies headache, fevers, malaise, unintentional weight loss, skin rash, eye pain, sinus congestion and sinus pain, sore throat, dysphagia,  hemoptysis , cough, dyspnea, wheezing, chest pain, palpitations, orthopnea, edema, abdominal pain, nausea, melena, diarrhea, constipation, flank pain, dysuria, hematuria, urinary  Frequency, nocturia, numbness, tingling, seizures,  Focal weakness, Loss of consciousness,  Tremor, insomnia, depression, anxiety, and suicidal ideation.    Objective:  BP 118/78 mmHg  Pulse 80  Temp(Src) 98.4 F (36.9 C) (Oral)  Resp 16  Ht 5' 6.25" (1.683 m)  Wt 187 lb (84.823 kg)  BMI 29.95 kg/m2  SpO2 98%  General appearance: alert, cooperative and appears stated age Ears: normal TM's and external ear canals both ears Throat: lips, mucosa, and tongue normal; teeth and gums normal Neck: no adenopathy, no carotid bruit, supple, symmetrical, trachea midline and thyroid not enlarged, symmetric, no tenderness/mass/nodules Back: symmetric, no curvature. ROM normal. No CVA tenderness. Lungs: clear to auscultation bilaterally Heart: regular rate and rhythm, S1, S2 normal, no murmur, click, rub or gallop Abdomen: soft, non-tender; bowel sounds normal; no masses,  no organomegaly Pulses: 2+ and symmetric Skin: Skin color, texture, turgor normal. No rashes or lesions Lymph nodes: Cervical, supraclavicular, and axillary nodes normal.  Assessment and Plan:   Problem List Items Addressed This Visit    ADD (attention deficit hyperactivity disorder, inattentive type)    Performing well on  current regimen of Adderall XR 20 mg qam and adderall 10 mg in afternoon        Generalized anxiety disorder    Well controlled on current regimen. , no changes today. Clonazepam ODT used prn and refilled              Elevated triglycerides with high cholesterol    Improved with low GI det and regular exercise  Lab Results  Component Value Date   CHOL 203* 01/11/2015   HDL 43.60 01/11/2015   LDLCALC 139* 01/11/2015   LDLDIRECT 148.0 07/26/2013   TRIG 100.0 01/11/2015   CHOLHDL 5 01/11/2015         Elevated blood pressure reading without diagnosis of hypertension    Managed with short acting inderal 2 times daily. Did not tolerate long acting       Encounter for wellness examination    Annual wellness  exam was done as well as a comprehensive physical exam and management of acute and chronic conditions .  During the course of the visit the patient was educated and counseled about appropriate screening and preventive services including :  diabetes screening, lipid analysis with projected  10 year  risk for CAD , nutrition counseling, colorectal cancer screening, and recommended immunizations.  Printed recommendations for health maintenance screenings was given.         Other Visit Diagnoses    Other fatigue    -  Primary    Relevant Orders    CBC with Differential/Platelet (Completed)    Comprehensive metabolic panel (Completed)    Hyperlipidemia        Relevant Orders    Lipid panel (Completed)    Need for hepatitis C screening test        Relevant Orders    Hepatitis C antibody (Completed)

## 2015-01-12 LAB — HEPATITIS C ANTIBODY: HCV AB: NEGATIVE

## 2015-01-13 ENCOUNTER — Encounter: Payer: Self-pay | Admitting: Internal Medicine

## 2015-01-13 DIAGNOSIS — Z0000 Encounter for general adult medical examination without abnormal findings: Secondary | ICD-10-CM | POA: Insufficient documentation

## 2015-01-13 NOTE — Assessment & Plan Note (Signed)

## 2015-01-13 NOTE — Assessment & Plan Note (Signed)
Managed with short acting inderal 2 times daily. Did not tolerate long acting

## 2015-01-13 NOTE — Assessment & Plan Note (Signed)
Improved with low GI det and regular exercise  Lab Results  Component Value Date   CHOL 203* 01/11/2015   HDL 43.60 01/11/2015   LDLCALC 139* 01/11/2015   LDLDIRECT 148.0 07/26/2013   TRIG 100.0 01/11/2015   CHOLHDL 5 01/11/2015

## 2015-01-13 NOTE — Assessment & Plan Note (Addendum)
Well controlled on current regimen. , no changes today. Clonazepam ODT used prn and refilled

## 2015-01-13 NOTE — Assessment & Plan Note (Signed)
Performing well on current regimen of Adderall XR 20 mg qam and adderall 10 mg in afternoon

## 2015-01-14 ENCOUNTER — Encounter: Payer: Self-pay | Admitting: *Deleted

## 2015-05-28 ENCOUNTER — Telehealth: Payer: Self-pay | Admitting: *Deleted

## 2015-05-28 MED ORDER — AMPHETAMINE-DEXTROAMPHET ER 20 MG PO CP24: 20.0000 mg | ORAL_CAPSULE | Freq: Every day | ORAL | Status: DC

## 2015-05-28 NOTE — Telephone Encounter (Signed)
Refill for 30 days only.  6 month follow up OFFICE VISIT NEEDED prior to any more refills

## 2015-05-28 NOTE — Telephone Encounter (Signed)
Pt called requesting Adderall refills.  Pt last OV 4.22.16. Please advise refill

## 2015-05-29 NOTE — Telephone Encounter (Signed)
Pt aware Rx ready for pick up 

## 2015-05-29 NOTE — Telephone Encounter (Signed)
Pt already scheduled 10.24.16.

## 2015-07-15 ENCOUNTER — Ambulatory Visit (INDEPENDENT_AMBULATORY_CARE_PROVIDER_SITE_OTHER): Payer: 59 | Admitting: Internal Medicine

## 2015-07-15 ENCOUNTER — Encounter: Payer: Self-pay | Admitting: Internal Medicine

## 2015-07-15 VITALS — BP 120/90 | HR 72 | Temp 98.4°F | Resp 12 | Ht 66.0 in | Wt 184.5 lb

## 2015-07-15 DIAGNOSIS — F900 Attention-deficit hyperactivity disorder, predominantly inattentive type: Secondary | ICD-10-CM | POA: Diagnosis not present

## 2015-07-15 DIAGNOSIS — R030 Elevated blood-pressure reading, without diagnosis of hypertension: Secondary | ICD-10-CM | POA: Diagnosis not present

## 2015-07-15 DIAGNOSIS — F411 Generalized anxiety disorder: Secondary | ICD-10-CM | POA: Diagnosis not present

## 2015-07-15 DIAGNOSIS — E663 Overweight: Secondary | ICD-10-CM

## 2015-07-15 MED ORDER — AMPHETAMINE-DEXTROAMPHETAMINE 10 MG PO TABS: 10.0000 mg | ORAL_TABLET | Freq: Two times a day (BID) | ORAL | Status: DC

## 2015-07-15 MED ORDER — AMPHETAMINE-DEXTROAMPHET ER 20 MG PO CP24: 20.0000 mg | ORAL_CAPSULE | Freq: Every day | ORAL | Status: DC

## 2015-07-15 NOTE — Assessment & Plan Note (Signed)
I have congratulated him in reduction of   BMI and encouraged  Continued weight loss with goal of 10% of body weigh over the next 6 months using a low glycemic index diet and regular exercise a minimum of 5 days per week.   

## 2015-07-15 NOTE — Progress Notes (Signed)
Pre-visit discussion using our clinic review tool. No additional management support is needed unless otherwise documented below in the visit note.  

## 2015-07-15 NOTE — Progress Notes (Signed)
Subjective:  Patient ID: Christopher Terrell, male    DOB: 06-04-1986  Age: 29 y.o. MRN: [id number]  CC: The primary encounter diagnosis was Overweight (BMI 25.0-29.9). Diagnoses of ADD (attention deficit hyperactivity disorder, inattentive type), Elevated blood pressure reading without diagnosis of hypertension, and Generalized anxiety disorder were also pertinent to this visit.  HPI Christopher Terrell presents for medication refill,  Using adderall XR and IR for management of ADD in stable doses .  He has been rationing his pills for the last month because his last refills ere not done correctly and he di dnot have the time to wait for the correction.   He  Takes 20 mg XR once daily and IR 10 mg twice daily.   Uses inderal for management of tachycardia and hypertension. Prior trials of LA inderal were not tolerated.  No other beta blocker trial.  Discuss trial of toprol XL   hAhas lost 8 lbs this year intentionally,  Exercising more.  No new complaints   Uses clonazepam sparingly.  Outpatient Prescriptions Prior to Visit  Medication Sig Dispense Refill  . clonazePAM (KLONOPIN) 0.25 MG disintegrating tablet Take 1 tablet (0.25 mg total) by mouth daily as needed. Keep on file for future refills 30 tablet 5  . propranolol (INDERAL) 10 MG tablet Take 0.5 tablets (5 mg total) by mouth 3 (three) times daily as needed. 90 tablet 2  . amphetamine-dextroamphetamine (ADDERALL XR) 20 MG 24 hr capsule Take 1 capsule (20 mg total) by mouth daily. 30 capsule 0  . amphetamine-dextroamphetamine (ADDERALL XR) 20 MG 24 hr capsule Take 1 capsule (20 mg total) by mouth daily. 30 capsule 0  . amphetamine-dextroamphetamine (ADDERALL) 10 MG tablet Take 1 tablet (10 mg total) by mouth 2 (two) times daily with a meal. 60 tablet 0   No facility-administered medications prior to visit.    Review of Systems;  Patient denies headache, fevers, malaise, unintentional weight loss, skin rash, eye pain, sinus congestion  and sinus pain, sore throat, dysphagia,  hemoptysis , cough, dyspnea, wheezing, chest pain, palpitations, orthopnea, edema, abdominal pain, nausea, melena, diarrhea, constipation, flank pain, dysuria, hematuria, urinary  Frequency, nocturia, numbness, tingling, seizures,  Focal weakness, Loss of consciousness,  Tremor, insomnia, depression, anxiety, and suicidal ideation.      Objective:  BP 120/90 mmHg  Pulse 72  Temp(Src) 98.4 F (36.9 C) (Oral)  Resp 12  Ht 5\' 6"  (1.676 m)  Wt 184 lb 8 oz (83.689 kg)  BMI 29.79 kg/m2  SpO2 97%  BP Readings from Last 3 Encounters:  07/15/15 120/90  01/11/15 118/78  07/02/14 134/92    Wt Readings from Last 3 Encounters:  07/15/15 184 lb 8 oz (83.689 kg)  01/11/15 187 lb (84.823 kg)  07/02/14 192 lb 12 oz (87.431 kg)    General appearance: alert, cooperative and appears stated age Ears: normal TM's and external ear canals both ears Throat: lips, mucosa, and tongue normal; teeth and gums normal Neck: no adenopathy, no carotid bruit, supple, symmetrical, trachea midline and thyroid not enlarged, symmetric, no tenderness/mass/nodules Back: symmetric, no curvature. ROM normal. No CVA tenderness. Lungs: clear to auscultation bilaterally Heart: regular rate and rhythm, S1, S2 normal, no murmur, click, rub or gallop Abdomen: soft, non-tender; bowel sounds normal; no masses,  no organomegaly Pulses: 2+ and symmetric Skin: Skin color, texture, turgor normal. No rashes or lesions Lymph nodes: Cervical, supraclavicular, and axillary nodes normal.  No results found for: HGBA1C  Lab Results  Component Value Date  CREATININE 1.00 01/11/2015   CREATININE 1.1 04/27/2014   CREATININE 1.12 04/27/2014    Lab Results  Component Value Date   WBC 9.2 01/11/2015   HGB 14.4 01/11/2015   HCT 41.9 01/11/2015   PLT 276.0 01/11/2015   GLUCOSE 75 01/11/2015   CHOL 203* 01/11/2015   TRIG 100.0 01/11/2015   HDL 43.60 01/11/2015   LDLDIRECT 148.0  07/26/2013   LDLCALC 139* 01/11/2015   ALT 31 01/11/2015   AST 25 01/11/2015   NA 137 01/11/2015   K 4.0 01/11/2015   CL 101 01/11/2015   CREATININE 1.00 01/11/2015   BUN 17 01/11/2015   CO2 29 01/11/2015   TSH 2.84 07/26/2013    No results found.  Assessment & Plan:   Problem List Items Addressed This Visit    Generalized anxiety disorder    Well controlled on current regimen. , no changes today. Clonazepam ODT used prn and refilled .  The risks and benefits of benzodiazepine use were discussed with patient today including excessive sedation leading to respiratory depression,  impaired thinking/driving, and addiction.  Patient was advised to avoid concurrent use with alcohol, to use medication only as needed and not to share with others  .               Elevated blood pressure reading without diagnosis of hypertension    Managed with inderal.  Discussed change to Toprol      ADD (attention deficit hyperactivity disorder, inattentive type)    Patient has been given refills for Adderall XR 20 mg and Adderall 10  Mg for 3 months starting Oct 24th, .   The risks and benefits of this medication have been reviewed with patient, including drug dependence,  increased risk of suicide , syncope, arrhythmia, sudden death, insomnia and, in her case , the  benefit of weight loss      Overweight (BMI 25.0-29.9) - Primary    I have congratulated him in reduction of   BMI and encouraged  Continued weight loss with goal of 10% of body weigh over the next 6 months using a low glycemic index diet and regular exercise a minimum of 5 days per week.           I am having [name] maintain his propranolol, clonazePAM, amphetamine-dextroamphetamine, and amphetamine-dextroamphetamine.  Meds ordered this encounter  Medications  . DISCONTD: amphetamine-dextroamphetamine (ADDERALL) 10 MG tablet    Sig: Take 1 tablet (10 mg total) by mouth 2 (two) times daily with a meal.    Dispense:  60  tablet    Refill:  0  . DISCONTD: amphetamine-dextroamphetamine (ADDERALL XR) 20 MG 24 hr capsule    Sig: Take 1 capsule (20 mg total) by mouth daily.    Dispense:  30 capsule    Refill:  0    May refill on or after August 15 2015  . DISCONTD: amphetamine-dextroamphetamine (ADDERALL XR) 20 MG 24 hr capsule    Sig: Take 1 capsule (20 mg total) by mouth daily.    Dispense:  30 capsule    Refill:  0  . DISCONTD: amphetamine-dextroamphetamine (ADDERALL) 10 MG tablet    Sig: Take 1 tablet (10 mg total) by mouth 2 (two) times daily with a meal.    Dispense:  60 tablet    Refill:  0    For refill on or after August 15 2015  . amphetamine-dextroamphetamine (ADDERALL XR) 20 MG 24 hr capsule    Sig: Take 1  capsule (20 mg total) by mouth daily.    Dispense:  30 capsule    Refill:  0    For refill on or after Sep 14 2015  . amphetamine-dextroamphetamine (ADDERALL) 10 MG tablet    Sig: Take 1 tablet (10 mg total) by mouth 2 (two) times daily with a meal.    Dispense:  60 tablet    Refill:  0    For refill on or after September 14 2015    Medications Discontinued During This Encounter  Medication Reason  . amphetamine-dextroamphetamine (ADDERALL) 10 MG tablet Reorder  . amphetamine-dextroamphetamine (ADDERALL XR) 20 MG 24 hr capsule Reorder  . amphetamine-dextroamphetamine (ADDERALL XR) 20 MG 24 hr capsule Reorder  . amphetamine-dextroamphetamine (ADDERALL XR) 20 MG 24 hr capsule   . amphetamine-dextroamphetamine (ADDERALL) 10 MG tablet Reorder  . amphetamine-dextroamphetamine (ADDERALL XR) 20 MG 24 hr capsule Reorder  . amphetamine-dextroamphetamine (ADDERALL) 10 MG tablet Reorder    Follow-up: No Follow-up on file.   Crecencio Mc, MD

## 2015-07-15 NOTE — Assessment & Plan Note (Signed)
Patient has been given refills for Adderall XR 20 mg and Adderall 10  Mg for 3 months starting Oct 24th, .   The risks and benefits of this medication have been reviewed with patient, including drug dependence,  increased risk of suicide , syncope, arrhythmia, sudden death, insomnia and, in her case , the  benefit of weight loss

## 2015-07-16 NOTE — Assessment & Plan Note (Signed)
Managed with inderal.  Discussed change to Toprol

## 2015-07-16 NOTE — Assessment & Plan Note (Signed)
Well controlled on current regimen. , no changes today. Clonazepam ODT used prn and refilled .  The risks and benefits of benzodiazepine use were discussed with patient today including excessive sedation leading to respiratory depression,  impaired thinking/driving, and addiction.  Patient was advised to avoid concurrent use with alcohol, to use medication only as needed and not to share with others  .

## 2015-09-24 ENCOUNTER — Other Ambulatory Visit: Payer: Self-pay | Admitting: Internal Medicine

## 2015-11-06 ENCOUNTER — Other Ambulatory Visit: Payer: Self-pay | Admitting: Internal Medicine

## 2015-11-06 NOTE — Telephone Encounter (Signed)
Pt called needing a refill prescription for amphetamine-dextroamphetamine (ADDERALL XR) 20 MG 24 hr capsule and amphetamine-dextroamphetamine (ADDERALL) 10 MG tablet both 90day supply. Please call pt when it's ready. Call pt @ [phone]. Thank you!

## 2015-11-06 NOTE — Telephone Encounter (Signed)
Please advise for refill.  ? ?Thanks! ?

## 2015-11-07 MED ORDER — AMPHETAMINE-DEXTROAMPHET ER 20 MG PO CP24: 20.0000 mg | ORAL_CAPSULE | Freq: Every day | ORAL | Status: DC

## 2015-11-07 MED ORDER — AMPHETAMINE-DEXTROAMPHETAMINE 10 MG PO TABS: 10.0000 mg | ORAL_TABLET | Freq: Two times a day (BID) | ORAL | Status: DC

## 2015-11-07 NOTE — Telephone Encounter (Signed)
I will refill for 30 day intervals, not 90 days be is due for office visit in April.  Please schedule.  Refills for now and march printed

## 2015-11-08 NOTE — Telephone Encounter (Signed)
Left a detailed message that patient's prescription is ready to be picked up and that it is only for 30 days for two month and that he needs to schedule an appt for April.

## 2016-03-10 ENCOUNTER — Telehealth: Payer: Self-pay | Admitting: *Deleted

## 2016-03-10 NOTE — Telephone Encounter (Signed)
Patient requested to be placed in Dr.Tullo 6 pm slot on 03/16/16 for a medication refill. He requested this time due to work.  Pt contact [phone]

## 2016-03-10 NOTE — Telephone Encounter (Signed)
Patient has been scheduled

## 2016-03-16 ENCOUNTER — Encounter: Payer: Self-pay | Admitting: Internal Medicine

## 2016-03-16 ENCOUNTER — Ambulatory Visit (INDEPENDENT_AMBULATORY_CARE_PROVIDER_SITE_OTHER): Payer: 59 | Admitting: Internal Medicine

## 2016-03-16 VITALS — BP 110/72 | HR 74 | Temp 98.1°F | Resp 14 | Wt 186.4 lb

## 2016-03-16 DIAGNOSIS — F411 Generalized anxiety disorder: Secondary | ICD-10-CM | POA: Diagnosis not present

## 2016-03-16 DIAGNOSIS — E785 Hyperlipidemia, unspecified: Secondary | ICD-10-CM | POA: Diagnosis not present

## 2016-03-16 DIAGNOSIS — E559 Vitamin D deficiency, unspecified: Secondary | ICD-10-CM

## 2016-03-16 DIAGNOSIS — I10 Essential (primary) hypertension: Secondary | ICD-10-CM

## 2016-03-16 DIAGNOSIS — F900 Attention-deficit hyperactivity disorder, predominantly inattentive type: Secondary | ICD-10-CM

## 2016-03-16 DIAGNOSIS — E782 Mixed hyperlipidemia: Secondary | ICD-10-CM

## 2016-03-16 MED ORDER — CLONAZEPAM 0.25 MG PO TBDP: 0.2500 mg | ORAL_TABLET | Freq: Every day | ORAL | Status: DC | PRN

## 2016-03-16 MED ORDER — AMPHETAMINE-DEXTROAMPHET ER 20 MG PO CP24: 20.0000 mg | ORAL_CAPSULE | Freq: Every day | ORAL | Status: DC

## 2016-03-16 MED ORDER — AMPHETAMINE-DEXTROAMPHETAMINE 10 MG PO TABS: 10.0000 mg | ORAL_TABLET | Freq: Two times a day (BID) | ORAL | Status: DC

## 2016-03-16 MED ORDER — PROPRANOLOL HCL 10 MG PO TABS: ORAL_TABLET | ORAL | Status: DC

## 2016-03-16 NOTE — Progress Notes (Signed)
Pre visit review using our clinic review tool, if applicable. No additional management support is needed unless otherwise documented below in the visit note. 

## 2016-03-16 NOTE — Progress Notes (Signed)
Subjective:  Patient ID: Christopher Terrell, male    DOB: 1986/02/13  Age: 30 y.o. MRN: [id number]  CC: The primary encounter diagnosis was Hyperlipidemia. Diagnoses of Vitamin D deficiency, Essential hypertension, Generalized anxiety disorder, ADD (attention deficit hyperactivity disorder, inattentive type), and Elevated triglycerides with high cholesterol were also pertinent to this visit.  HPI Kohler W Costa Terrell presents for MEDICATION REFILL and follow up on ADD , GAD and hypertension.  Patient has been taking the medication as prescribed and functioning well both at work and at home.  His  side effects including tachycardia, nervousness and insomnia are managed with propranol and clonazepam and he has not escalated his use  Of clonazepam.    ON CALL 20 DAYS PER MONTH      Outpatient Prescriptions Prior to Visit  Medication Sig Dispense Refill  . amphetamine-dextroamphetamine (ADDERALL XR) 20 MG 24 hr capsule Take 1 capsule (20 mg total) by mouth daily. 30 capsule 0  . amphetamine-dextroamphetamine (ADDERALL) 10 MG tablet Take 1 tablet (10 mg total) by mouth 2 (two) times daily with a meal. 60 tablet 0  . clonazePAM (KLONOPIN) 0.25 MG disintegrating tablet Take 1 tablet (0.25 mg total) by mouth daily as needed. Keep on file for future refills 30 tablet 5  . propranolol (INDERAL) 10 MG tablet TAKE 1/2 TABLET BY MOUTH 3 TIMES DAILY AS NEEDED 90 tablet 2   No facility-administered medications prior to visit.    Review of Systems;  Patient denies headache, fevers, malaise, unintentional weight loss, skin rash, eye pain, sinus congestion and sinus pain, sore throat, dysphagia,  hemoptysis , cough, dyspnea, wheezing, chest pain, palpitations, orthopnea, edema, abdominal pain, nausea, melena, diarrhea, constipation, flank pain, dysuria, hematuria, urinary  Frequency, nocturia, numbness, tingling, seizures,  Focal weakness, Loss of consciousness,  Tremor, insomnia, depression, anxiety, and  suicidal ideation.      Objective:  BP 110/72 mmHg  Pulse 74  Temp(Src) 98.1 F (36.7 C) (Oral)  Resp 14  Wt 186 lb 6.4 oz (84.55 kg)  SpO2 97%  BP Readings from Last 3 Encounters:  03/16/16 110/72  07/15/15 120/90  01/11/15 118/78    Wt Readings from Last 3 Encounters:  03/16/16 186 lb 6.4 oz (84.55 kg)  07/15/15 184 lb 8 oz (83.689 kg)  01/11/15 187 lb (84.823 kg)    General appearance: alert, cooperative and appears stated age Ears: normal TM's and external ear canals both ears Throat: lips, mucosa, and tongue normal; teeth and gums normal Neck: no adenopathy, no carotid bruit, supple, symmetrical, trachea midline and thyroid not enlarged, symmetric, no tenderness/mass/nodules Back: symmetric, no curvature. ROM normal. No CVA tenderness. Lungs: clear to auscultation bilaterally Heart: regular rate and rhythm, S1, S2 normal, no murmur, click, rub or gallop Abdomen: soft, non-tender; bowel sounds normal; no masses,  no organomegaly Pulses: 2+ and symmetric Skin: Skin color, texture, turgor normal. No rashes or lesions Lymph nodes: Cervical, supraclavicular, and axillary nodes normal.  No results found for: HGBA1C  Lab Results  Component Value Date   CREATININE 1.09 03/16/2016   CREATININE 1.00 01/11/2015   CREATININE 1.1 04/27/2014   CREATININE 1.12 04/27/2014    Lab Results  Component Value Date   WBC 9.2 01/11/2015   HGB 14.4 01/11/2015   HCT 41.9 01/11/2015   PLT 276.0 01/11/2015   GLUCOSE 105* 03/16/2016   CHOL 188 03/16/2016   TRIG * 03/16/2016    520.0 Triglyceride is over 400; calculations on Lipids are invalid.   HDL 40.60 03/16/2016  LDLDIRECT 117.0 03/16/2016   LDLCALC 139* 01/11/2015   ALT 43 03/16/2016   AST 31 03/16/2016   NA 138 03/16/2016   K 4.0 03/16/2016   CL 105 03/16/2016   CREATININE 1.09 03/16/2016   BUN 14 03/16/2016   CO2 26 03/16/2016   TSH 2.84 07/26/2013    No results found.  Assessment & Plan:   Problem List  Items Addressed This Visit    ADD (attention deficit hyperactivity disorder, inattentive type)    Patient has been given refills for Adderall XR 20 mg and Adderall 10  Mg for 3 months  .   The risks and benefits of this medication have been reviewed with patient, including drug dependence,  increased risk of suicide , syncope, arrhythmia, sudden death, insomnia and, in her case , the  benefit of weight loss        Generalized anxiety disorder    Well controlled on current regimen. , no changes today. Clonazepam ODT used prn and refilled .  The risks and benefits of benzodiazepine use were reviewed with patient today including excessive sedation leading to respiratory depression,  impaired thinking/driving, and addiction.  Patient was advised to avoid concurrent use with alcohol, to use medication only as needed and not to share with others  .                 Elevated triglycerides with high cholesterol    Worsening ,  With triglycerides now over 400.  Will recommend trial of tricor.      Lab Results  Component Value Date   CHOL 188 03/16/2016   HDL 40.60 03/16/2016   LDLCALC 139* 01/11/2015   LDLDIRECT 117.0 03/16/2016   TRIG * 03/16/2016    520.0 Triglyceride is over 400; calculations on Lipids are invalid.   CHOLHDL 5 03/16/2016           Relevant Medications   propranolol (INDERAL) 10 MG tablet    Other Visit Diagnoses    Hyperlipidemia    -  Primary    Relevant Medications    propranolol (INDERAL) 10 MG tablet    Other Relevant Orders    Lipid panel (Completed)    Vitamin D deficiency        Relevant Orders    VITAMIN D 25 Hydroxy (Vit-D Deficiency, Fractures) (Completed)    Essential hypertension        Relevant Medications    propranolol (INDERAL) 10 MG tablet    Other Relevant Orders    Comprehensive metabolic panel (Completed)       I am having [name] maintain his amphetamine-dextroamphetamine, amphetamine-dextroamphetamine, clonazePAM, and  propranolol.  Meds ordered this encounter  Medications  . DISCONTD: amphetamine-dextroamphetamine (ADDERALL XR) 20 MG 24 hr capsule    Sig: Take 1 capsule (20 mg total) by mouth daily.    Dispense:  30 capsule    Refill:  0  . DISCONTD: amphetamine-dextroamphetamine (ADDERALL) 10 MG tablet    Sig: Take 1 tablet (10 mg total) by mouth 2 (two) times daily with a meal.    Dispense:  60 tablet    Refill:  0  . DISCONTD: amphetamine-dextroamphetamine (ADDERALL XR) 20 MG 24 hr capsule    Sig: Take 1 capsule (20 mg total) by mouth daily.    Dispense:  30 capsule    Refill:  0    May refill on or after April 15 2016  . DISCONTD: amphetamine-dextroamphetamine (ADDERALL) 10 MG tablet    Sig: Take 1  tablet (10 mg total) by mouth 2 (two) times daily with a meal.    Dispense:  60 tablet    Refill:  0    mAY REFILL ON OR AFTER April 15 2016  . amphetamine-dextroamphetamine (ADDERALL XR) 20 MG 24 hr capsule    Sig: Take 1 capsule (20 mg total) by mouth daily.    Dispense:  30 capsule    Refill:  0    May refill on or after May 16 2016  . amphetamine-dextroamphetamine (ADDERALL) 10 MG tablet    Sig: Take 1 tablet (10 mg total) by mouth 2 (two) times daily with a meal.    Dispense:  60 tablet    Refill:  0    MAY REFILL ON OR AFTER May 16 2016  . clonazePAM (KLONOPIN) 0.25 MG disintegrating tablet    Sig: Take 1 tablet (0.25 mg total) by mouth daily as needed. Keep on file for future refills    Dispense:  30 tablet    Refill:  5  . propranolol (INDERAL) 10 MG tablet    Sig: TAKE 1/2 TABLET BY MOUTH 3 TIMES DAILY AS NEEDED    Dispense:  90 tablet    Refill:  2    Medications Discontinued During This Encounter  Medication Reason  . amphetamine-dextroamphetamine (ADDERALL XR) 20 MG 24 hr capsule Reorder  . amphetamine-dextroamphetamine (ADDERALL) 10 MG tablet Reorder  . amphetamine-dextroamphetamine (ADDERALL XR) 20 MG 24 hr capsule Reorder  . amphetamine-dextroamphetamine (ADDERALL)  10 MG tablet Reorder  . amphetamine-dextroamphetamine (ADDERALL XR) 20 MG 24 hr capsule Reorder  . amphetamine-dextroamphetamine (ADDERALL) 10 MG tablet Reorder  . clonazePAM (KLONOPIN) 0.25 MG disintegrating tablet Reorder  . propranolol (INDERAL) 10 MG tablet Reorder    Follow-up: No Follow-up on file.   Crecencio Mc, MD

## 2016-03-17 ENCOUNTER — Other Ambulatory Visit: Payer: Self-pay | Admitting: Internal Medicine

## 2016-03-17 DIAGNOSIS — E781 Pure hyperglyceridemia: Secondary | ICD-10-CM

## 2016-03-17 LAB — COMPREHENSIVE METABOLIC PANEL
ALBUMIN: 4.4 g/dL (ref 3.5–5.2)
ALK PHOS: 85 U/L (ref 39–117)
ALT: 43 U/L (ref 0–53)
AST: 31 U/L (ref 0–37)
BILIRUBIN TOTAL: 0.3 mg/dL (ref 0.2–1.2)
BUN: 14 mg/dL (ref 6–23)
CALCIUM: 9.4 mg/dL (ref 8.4–10.5)
CO2: 26 mEq/L (ref 19–32)
CREATININE: 1.09 mg/dL (ref 0.40–1.50)
Chloride: 105 mEq/L (ref 96–112)
GFR: 83.44 mL/min (ref >60.00)
Glucose, Bld: 105 mg/dL — ABNORMAL HIGH (ref 70–99)
Potassium: 4 mEq/L (ref 3.5–5.1)
Sodium: 138 mEq/L (ref 135–145)
TOTAL PROTEIN: 7.4 g/dL (ref 6.0–8.3)

## 2016-03-17 LAB — LIPID PANEL
CHOLESTEROL: 188 mg/dL (ref 0–200)
HDL: 40.6 mg/dL (ref >39.00)
Total CHOL/HDL Ratio: 5

## 2016-03-17 LAB — LDL CHOLESTEROL, DIRECT: LDL DIRECT: 117 mg/dL

## 2016-03-17 LAB — VITAMIN D 25 HYDROXY (VIT D DEFICIENCY, FRACTURES): VITD: 32.03 ng/mL (ref 30.00–100.00)

## 2016-03-17 NOTE — Assessment & Plan Note (Signed)
Well controlled on current regimen. , no changes today. Clonazepam ODT used prn and refilled .  The risks and benefits of benzodiazepine use were reviewed with patient today including excessive sedation leading to respiratory depression,  impaired thinking/driving, and addiction.  Patient was advised to avoid concurrent use with alcohol, to use medication only as needed and not to share with others  .   

## 2016-03-17 NOTE — Assessment & Plan Note (Signed)
Worsening ,  With triglycerides now over 400.  Will recommend trial of tricor.      Lab Results  Component Value Date   CHOL 188 03/16/2016   HDL 40.60 03/16/2016   LDLCALC 139* 01/11/2015   LDLDIRECT 117.0 03/16/2016   TRIG * 03/16/2016    520.0 Triglyceride is over 400; calculations on Lipids are invalid.   CHOLHDL 5 03/16/2016

## 2016-03-17 NOTE — Assessment & Plan Note (Signed)
Patient has been given refills for Adderall XR 20 mg and Adderall 10  Mg for 3 months  .   The risks and benefits of this medication have been reviewed with patient, including drug dependence,  increased risk of suicide , syncope, arrhythmia, sudden death, insomnia and, in her case , the  benefit of weight loss

## 2016-08-11 DIAGNOSIS — H5213 Myopia, bilateral: Secondary | ICD-10-CM | POA: Diagnosis not present

## 2016-08-28 ENCOUNTER — Encounter: Payer: Self-pay | Admitting: Physician Assistant

## 2016-08-28 ENCOUNTER — Ambulatory Visit: Payer: Self-pay | Admitting: Physician Assistant

## 2016-08-28 VITALS — BP 120/86 | HR 88 | Temp 98.6°F

## 2016-08-28 DIAGNOSIS — H1032 Unspecified acute conjunctivitis, left eye: Secondary | ICD-10-CM

## 2016-08-28 MED ORDER — TOBRAMYCIN-DEXAMETHASONE 0.3-0.1 % OP SUSP
2.0000 [drp] | OPHTHALMIC | 0 refills | Status: DC
Start: 2016-08-28 — End: 2017-01-25

## 2016-08-28 NOTE — Progress Notes (Signed)
S:  C/o left eye being irritated, no matting, sx started last pm, has hx of seasonal allergies but this is different, denies, cough, congestion, fever, chills, v/d; remainder ros neg, did get a new set of contacts about 2 weeks ago, felt better when took contacts out  O: vitals wnl, nad, perrl eomi, left eye with injected conjunctiva and redness extends to around iris; no drainage or matting noted at this time, tms clear, nasal mucosa inflamed, throat wnl, neck supple no lymph, lungs c t a, cv rrr  A:  Acute conjunctivitis, ?iritis  P: tobradex opth gtts, return if not better in 3 - 5d, if worsening return earlier or see eye doctor,

## 2016-09-07 ENCOUNTER — Encounter: Payer: Self-pay | Admitting: Physician Assistant

## 2016-09-07 ENCOUNTER — Ambulatory Visit: Payer: Self-pay | Admitting: Physician Assistant

## 2016-09-07 VITALS — BP 130/90 | Temp 98.5°F

## 2016-09-07 DIAGNOSIS — L989 Disorder of the skin and subcutaneous tissue, unspecified: Secondary | ICD-10-CM

## 2016-09-07 MED ORDER — SULFAMETHOXAZOLE-TRIMETHOPRIM 800-160 MG PO TABS: 1.0000 | ORAL_TABLET | Freq: Two times a day (BID) | ORAL | 0 refills | Status: DC

## 2016-09-07 NOTE — Progress Notes (Signed)
S: c/o skin lesion on underside of penis, states it was an ingrown hair and he popped it, it got better but now there's a lesion with a little drainage, did have oral sex with someone, no intercourse without a condom, no fever/chills, no other skin issues  O: vitals wnl, nad, skin with small sore on penis near scrotum, some drainage, no abscess noted, area is a little tender, n/v intact  A: skin lesion  P: septra ds, lab for RPR

## 2016-09-10 ENCOUNTER — Other Ambulatory Visit
Admission: RE | Admit: 2016-09-10 | Discharge: 2016-09-10 | Disposition: A | Discharge status: Home / Self Care | Payer: 59 | Source: Ambulatory Visit | Attending: Physician Assistant | Admitting: Physician Assistant

## 2016-09-10 DIAGNOSIS — L989 Disorder of the skin and subcutaneous tissue, unspecified: Secondary | ICD-10-CM | POA: Diagnosis not present

## 2016-09-11 LAB — RPR: RPR Ser Ql: NONREACTIVE

## 2016-09-11 NOTE — Progress Notes (Signed)
Spoke with patient informed him that lab result neg. Stated that it is getting better

## 2017-01-25 ENCOUNTER — Encounter: Payer: Self-pay | Admitting: Physician Assistant

## 2017-01-25 ENCOUNTER — Ambulatory Visit: Payer: Self-pay | Admitting: Physician Assistant

## 2017-01-25 VITALS — BP 140/92 | HR 86 | Temp 98.6°F

## 2017-01-25 DIAGNOSIS — L239 Allergic contact dermatitis, unspecified cause: Secondary | ICD-10-CM

## 2017-01-25 MED ORDER — TRIAMCINOLONE ACETONIDE 0.1 % EX CREA: 1.0000 "application " | TOPICAL_CREAM | Freq: Two times a day (BID) | CUTANEOUS | 3 refills | Status: DC

## 2017-01-25 NOTE — Progress Notes (Signed)
S: c/o rash on hands, thinks it is from the gloves in the OR, has been trying out different ones and thinks he found one that helps, states the areas are not as red and angry and appear to be more dry, no drainage from the sites  O: vitals wnl, nad, skin with flea bites on arms b/l, both hands have dried scaly lesions on dorsal surface, no drainage, areas are red, some crusting, n/v intact  A: contact dermatitis vs psoriasis  P: triamcinolone cream, discuss glove options with Christopher Terrell at employee health

## 2017-03-12 ENCOUNTER — Telehealth: Payer: Self-pay | Admitting: Internal Medicine

## 2017-03-12 NOTE — Telephone Encounter (Signed)
I scheduled the patient for 7.2.18 @ 6:00. Will that date be ok with the patient?

## 2017-03-12 NOTE — Telephone Encounter (Signed)
Please advise 

## 2017-03-12 NOTE — Telephone Encounter (Signed)
Yes but not this Monday since we are using it for urgent slots

## 2017-03-12 NOTE — Telephone Encounter (Signed)
Could you scheduled this pt an appt for a Monday(just not next Monday) at 6pm per Dr. Derrel Nip? Thank you!

## 2017-03-12 NOTE — Telephone Encounter (Signed)
Pt has been informed of appt date and time. Pt is ok with appt.

## 2017-03-12 NOTE — Telephone Encounter (Signed)
Pt called and was wondering if he could get a 6 pm appt for a follow up. Please advise, thank you!  Call pt @ [phone]

## 2017-03-22 ENCOUNTER — Encounter: Payer: Self-pay | Admitting: Internal Medicine

## 2017-03-22 ENCOUNTER — Ambulatory Visit (INDEPENDENT_AMBULATORY_CARE_PROVIDER_SITE_OTHER): Payer: 59 | Admitting: Internal Medicine

## 2017-03-22 VITALS — BP 112/82 | HR 81 | Temp 99.1°F | Resp 16 | Ht 66.0 in | Wt 188.2 lb

## 2017-03-22 DIAGNOSIS — F411 Generalized anxiety disorder: Secondary | ICD-10-CM | POA: Diagnosis not present

## 2017-03-22 DIAGNOSIS — R5383 Other fatigue: Secondary | ICD-10-CM | POA: Diagnosis not present

## 2017-03-22 DIAGNOSIS — E785 Hyperlipidemia, unspecified: Secondary | ICD-10-CM

## 2017-03-22 DIAGNOSIS — F900 Attention-deficit hyperactivity disorder, predominantly inattentive type: Secondary | ICD-10-CM | POA: Diagnosis not present

## 2017-03-22 MED ORDER — PROPRANOLOL HCL 10 MG PO TABS
ORAL_TABLET | ORAL | 5 refills | Status: DC
Start: 2017-03-22 — End: 2018-04-07

## 2017-03-22 MED ORDER — AMPHETAMINE-DEXTROAMPHETAMINE 10 MG PO TABS: 10.0000 mg | ORAL_TABLET | Freq: Two times a day (BID) | ORAL | 0 refills | Status: DC

## 2017-03-22 MED ORDER — AMPHETAMINE-DEXTROAMPHET ER 20 MG PO CP24: 20.0000 mg | ORAL_CAPSULE | Freq: Every day | ORAL | 0 refills | Status: DC

## 2017-03-22 NOTE — Progress Notes (Signed)
Subjective:  Patient ID: Christopher Terrell, male    DOB: 1986-09-17  Age: 31 y.o. MRN: [id number]  CC: The primary encounter diagnosis was Fatigue, unspecified type. Diagnoses of Attention deficit hyperactivity disorder (ADHD), predominantly inattentive type and Generalized anxiety disorder were also pertinent to this visit.  HPI [name] presents for follow up on ADHD managed with Adderall XR.  Last seen one year ago .  Last authorized refill May 17 2016. He has not run out because he does not take the medication on the weekends and during vacation.  He is functioning well on his current regimen of adderall xr 20 mg in the morning and short acting adderall 20 mg daily.  He takes 5 mg propranolol daily to manage the tachycardia associated with the medication.   GAD: he continues to use clonazepam as needed for insonnia     Outpatient Medications Prior to Visit  Medication Sig Dispense Refill  . clonazePAM (KLONOPIN) 0.25 MG disintegrating tablet Take 1 tablet (0.25 mg total) by mouth daily as needed. Keep on file for future refills 30 tablet 5  . amphetamine-dextroamphetamine (ADDERALL XR) 20 MG 24 hr capsule Take 1 capsule (20 mg total) by mouth daily. 30 capsule 0  . amphetamine-dextroamphetamine (ADDERALL) 10 MG tablet Take 1 tablet (10 mg total) by mouth 2 (two) times daily with a meal. 60 tablet 0  . propranolol (INDERAL) 10 MG tablet TAKE 1/2 TABLET BY MOUTH 3 TIMES DAILY AS NEEDED 90 tablet 2  . triamcinolone cream (KENALOG) 0.1 % Apply 1 application topically 2 (two) times daily. 30 g 3   No facility-administered medications prior to visit.     Review of Systems;  Patient denies headache, fevers, malaise, unintentional weight loss, skin rash, eye pain, sinus congestion and sinus pain, sore throat, dysphagia,  hemoptysis , cough, dyspnea, wheezing, chest pain, palpitations, orthopnea, edema, abdominal pain, nausea, melena, diarrhea, constipation, flank pain, dysuria,  hematuria, urinary  Frequency, nocturia, numbness, tingling, seizures,  Focal weakness, Loss of consciousness,  Tremor, insomnia, depression, anxiety, and suicidal ideation.      Objective:  BP 112/82   Pulse 81   Temp 99.1 F (37.3 C) (Oral)   Resp 16   Ht 5' 6"  (1.676 m)   Wt 188 lb 3.2 oz (85.4 kg)   SpO2 97%   BMI 30.38 kg/m   BP Readings from Last 3 Encounters:  03/22/17 112/82  01/25/17 (!) 140/92  09/07/16 130/90    Wt Readings from Last 3 Encounters:  03/22/17 188 lb 3.2 oz (85.4 kg)  03/16/16 186 lb 6.4 oz (84.6 kg)  07/15/15 184 lb 8 oz (83.7 kg)    General appearance: alert, cooperative and appears stated age Ears: normal TM's and external ear canals both ears Throat: lips, mucosa, and tongue normal; teeth and gums normal Neck: no adenopathy, no carotid bruit, supple, symmetrical, trachea midline and thyroid not enlarged, symmetric, no tenderness/mass/nodules Back: symmetric, no curvature. ROM normal. No CVA tenderness. Lungs: clear to auscultation bilaterally Heart: regular rate and rhythm, S1, S2 normal, no murmur, click, rub or gallop Abdomen: soft, non-tender; bowel sounds normal; no masses,  no organomegaly Pulses: 2+ and symmetric Skin: Skin color, texture, turgor normal. No rashes or lesions Lymph nodes: Cervical, supraclavicular, and axillary nodes normal.  No results found for: HGBA1C  Lab Results  Component Value Date   CREATININE 1.09 03/16/2016   CREATININE 1.00 01/11/2015   CREATININE 1.1 04/27/2014   CREATININE 1.12 04/27/2014  Lab Results  Component Value Date   WBC 9.2 01/11/2015   HGB 14.4 01/11/2015   HCT 41.9 01/11/2015   PLT 276.0 01/11/2015   GLUCOSE 105 (H) 03/16/2016   CHOL 188 03/16/2016   TRIG (H) 03/16/2016    520.0 Triglyceride is over 400; calculations on Lipids are invalid.   HDL 40.60 03/16/2016   LDLDIRECT 117.0 03/16/2016   LDLCALC 139 (H) 01/11/2015   ALT 43 03/16/2016   AST 31 03/16/2016   NA 138  03/16/2016   K 4.0 03/16/2016   CL 105 03/16/2016   CREATININE 1.09 03/16/2016   BUN 14 03/16/2016   CO2 26 03/16/2016   TSH 2.84 07/26/2013    No results found.  Assessment & Plan:   Problem List Items Addressed This Visit    Attention deficit hyperactivity disorder (ADHD), predominantly inattentive type    Patient has been given refills for Adderall XR 20 mg and Adderall 10  Mg for 3 months  .   The risks and benefits of this medication have been reviewed with patient, including drug dependence,  increased risk of suicide , syncope, arrhythmia, sudden death, insomnia and, in her case , the  benefit of weight loss.  Refill history confirmed via [place] Controlled Substance databas, accessed by me today..        Generalized anxiety disorder    Well controlled on current regimen. , no changes today. Clonazepam ODT used prn and refilled .  The risks and benefits of benzodiazepine use were reviewed with patient today including excessive sedation leading to respiratory depression,  impaired thinking/driving, and addiction.  Patient was advised to avoid concurrent use with alcohol, to use medication only as needed and not to share with others  .         Other Visit Diagnoses    Fatigue, unspecified type    -  Primary   Relevant Orders   Comp Met (CMET)      I have discontinued Mr. Siek's triamcinolone cream. I am also having him maintain his clonazePAM, amphetamine-dextroamphetamine, amphetamine-dextroamphetamine, and propranolol.  Meds ordered this encounter  Medications  . DISCONTD: amphetamine-dextroamphetamine (ADDERALL XR) 20 MG 24 hr capsule    Sig: Take 1 capsule (20 mg total) by mouth daily.    Dispense:  30 capsule    Refill:  0  . DISCONTD: amphetamine-dextroamphetamine (ADDERALL) 10 MG tablet    Sig: Take 1 tablet (10 mg total) by mouth 2 (two) times daily with a meal.    Dispense:  60 tablet    Refill:  0  . DISCONTD: amphetamine-dextroamphetamine (ADDERALL) 10 MG  tablet    Sig: Take 1 tablet (10 mg total) by mouth 2 (two) times daily with a meal.    Dispense:  60 tablet    Refill:  0    May refill on or after April 22 2017  . DISCONTD: amphetamine-dextroamphetamine (ADDERALL XR) 20 MG 24 hr capsule    Sig: Take 1 capsule (20 mg total) by mouth daily.    Dispense:  30 capsule    Refill:  0    May refill on or after April 22 2017  . amphetamine-dextroamphetamine (ADDERALL XR) 20 MG 24 hr capsule    Sig: Take 1 capsule (20 mg total) by mouth daily.    Dispense:  30 capsule    Refill:  0    May refill on or after May 23 2017  . amphetamine-dextroamphetamine (ADDERALL) 10 MG tablet    Sig: Take 1 tablet (  10 mg total) by mouth 2 (two) times daily with a meal.    Dispense:  60 tablet    Refill:  0    May refill on or after May 23 2017  . propranolol (INDERAL) 10 MG tablet    Sig: TAKE 1/2 TABLET BY MOUTH 3 TIMES DAILY AS NEEDED    Dispense:  90 tablet    Refill:  5    Medications Discontinued During This Encounter  Medication Reason  . triamcinolone cream (KENALOG) 0.1 % No longer needed (for PRN medications)  . amphetamine-dextroamphetamine (ADDERALL XR) 20 MG 24 hr capsule Reorder  . amphetamine-dextroamphetamine (ADDERALL) 10 MG tablet Reorder  . amphetamine-dextroamphetamine (ADDERALL) 10 MG tablet Reorder  . amphetamine-dextroamphetamine (ADDERALL XR) 20 MG 24 hr capsule Reorder  . amphetamine-dextroamphetamine (ADDERALL XR) 20 MG 24 hr capsule Reorder  . amphetamine-dextroamphetamine (ADDERALL) 10 MG tablet Reorder  . propranolol (INDERAL) 10 MG tablet Reorder    Follow-up: No Follow-up on file.   Crecencio Mc, MD

## 2017-03-22 NOTE — Patient Instructions (Signed)
I have refilled your meds for 3 months.   When you come by in 3 months for next refills,  Schedule a fasting lab appt

## 2017-03-23 NOTE — Assessment & Plan Note (Addendum)
Patient has been given refills for Adderall XR 20 mg and Adderall 10  Mg for 3 months  .   The risks and benefits of this medication have been reviewed with patient, including drug dependence,  increased risk of suicide , syncope, arrhythmia, sudden death, insomnia and, in her case , the  benefit of weight loss.  Refill history confirmed via [place] Controlled Substance databas, accessed by me today.Marland Kitchen

## 2017-03-23 NOTE — Assessment & Plan Note (Signed)
Well controlled on current regimen. , no changes today. Clonazepam ODT used prn and refilled .  The risks and benefits of benzodiazepine use were reviewed with patient today including excessive sedation leading to respiratory depression,  impaired thinking/driving, and addiction.  Patient was advised to avoid concurrent use with alcohol, to use medication only as needed and not to share with others  .   

## 2017-07-26 ENCOUNTER — Other Ambulatory Visit: Payer: Self-pay | Admitting: Internal Medicine

## 2017-07-27 ENCOUNTER — Encounter: Payer: Self-pay | Admitting: Physician Assistant

## 2017-07-27 ENCOUNTER — Ambulatory Visit: Payer: Self-pay | Admitting: Physician Assistant

## 2017-07-27 VITALS — BP 120/84 | HR 95 | Temp 98.6°F

## 2017-07-27 DIAGNOSIS — L259 Unspecified contact dermatitis, unspecified cause: Secondary | ICD-10-CM

## 2017-07-27 MED ORDER — DEXAMETHASONE SODIUM PHOSPHATE 10 MG/ML IJ SOLN
10.0000 mg | Freq: Once | INTRAMUSCULAR | Status: AC
  Administered 2017-07-27: 10 mg via INTRAMUSCULAR

## 2017-07-27 MED ORDER — PREDNISONE 10 MG (48) PO TBPK: ORAL_TABLET | ORAL | 0 refills | Status: DC

## 2017-07-27 NOTE — Progress Notes (Signed)
S: c/o rash on hands and forearms for over 2 weeks, used otc hydrocortisone cream without relief, no known contact with poison ivy, has really sensitive skin, no fever/chills  O: vitals wnl, nad, skin with several red irritated areas on hands, forearms, some are weeping where pt has picked the scab off, one on forearm is size of quarter, no pus or drainage  A: acute contact dermatitis  P: decadron 10mg  IM, sterapred ds 10mg  12 d dose pack, pt to wait a day and then start steroid pack

## 2017-07-28 ENCOUNTER — Other Ambulatory Visit: Payer: Self-pay | Admitting: Internal Medicine

## 2017-07-28 MED ORDER — AMPHETAMINE-DEXTROAMPHET ER 20 MG PO CP24: ORAL_CAPSULE | ORAL | 0 refills | Status: DC

## 2017-07-28 MED ORDER — AMPHETAMINE-DEXTROAMPHETAMINE 10 MG PO TABS: ORAL_TABLET | ORAL | 0 refills | Status: DC

## 2017-07-28 NOTE — Telephone Encounter (Signed)
Pt needs a refill for clonazePAM (KLONOPIN) 0.25 MG disintegrating tablet.  Pharmacy is [facility], [address]  Call pt @ [phone]. Thank you!

## 2017-07-28 NOTE — Telephone Encounter (Signed)
Pt was notified that rxs are ready to be picked up. Pt stated that he would be by either this evening or in the morning. Also scheduled pt a follow up appt in February. Pt is aware of appt date and time.

## 2017-07-28 NOTE — Telephone Encounter (Signed)
REFILLS FOR ADDERAL XR 20 AND ADDERALL 10 MG  FOR November, D December AND Crissie Figures .  NEEDS APPT IN EARLY February

## 2017-07-28 NOTE — Telephone Encounter (Signed)
Last OV with 7/18 last script 9/18.

## 2017-07-30 NOTE — Telephone Encounter (Signed)
Refilled: 03/17/2016 Last OV: 03/22/2017 Next OV: 10/25/2017

## 2017-07-31 MED ORDER — CLONAZEPAM 0.25 MG PO TBDP: 0.2500 mg | ORAL_TABLET | Freq: Every day | ORAL | 5 refills | Status: DC | PRN

## 2017-07-31 NOTE — Telephone Encounter (Signed)
Refill authorized from [facility],  Needs to be printed signed and sent

## 2017-08-02 NOTE — Telephone Encounter (Signed)
Printed, signed and faxed.  

## 2017-08-16 ENCOUNTER — Encounter: Payer: Self-pay | Admitting: Physician Assistant

## 2017-08-16 ENCOUNTER — Ambulatory Visit: Payer: Self-pay | Admitting: Physician Assistant

## 2017-08-16 VITALS — BP 128/90 | HR 110 | Temp 98.5°F

## 2017-08-16 DIAGNOSIS — R21 Rash and other nonspecific skin eruption: Secondary | ICD-10-CM

## 2017-08-16 MED ORDER — DEXAMETHASONE SODIUM PHOSPHATE 10 MG/ML IJ SOLN
10.0000 mg | Freq: Once | INTRAMUSCULAR | Status: AC
  Administered 2017-08-16: 10 mg via INTRAMUSCULAR

## 2017-08-16 MED ORDER — HYDROXYZINE HCL 50 MG PO TABS: 50.0000 mg | ORAL_TABLET | Freq: Three times a day (TID) | ORAL | 0 refills | Status: DC | PRN

## 2017-08-16 MED ORDER — METHYLPREDNISOLONE 4 MG PO TBPK: ORAL_TABLET | ORAL | 0 refills | Status: DC

## 2017-08-16 NOTE — Progress Notes (Signed)
   Subjective: Rash     Patient ID: Christopher Terrell [place], male    DOB: 1986-03-08, 31 y.o.   MRN: [id number]  HPI Patient complain for rash to the upper body sparing the forearms. Patient believe the rash is secondary to wearing Discharge. Mouth wash and after purchase. Patient has a history of skin sensitivity. Patient stated the rash is itching. Patient state mild tra.ability to Benadryl.   Review of Systems ADHD and anxiety.    Objective:   Physical Exam Erythematous macular lesions anterior posterior of her body sparing the forearms.       Assessment & Plan: Contact dermatitis   Patient given Decadron 10 mg in the clinic follow-up with prescriptions for prednisone and Atarax. Patient advised follow-up PCP if no improvement in one week.

## 2017-08-25 DIAGNOSIS — D225 Melanocytic nevi of trunk: Secondary | ICD-10-CM | POA: Diagnosis not present

## 2017-08-25 DIAGNOSIS — D2272 Melanocytic nevi of left lower limb, including hip: Secondary | ICD-10-CM | POA: Diagnosis not present

## 2017-08-25 DIAGNOSIS — D2271 Melanocytic nevi of right lower limb, including hip: Secondary | ICD-10-CM | POA: Diagnosis not present

## 2017-08-25 DIAGNOSIS — L2089 Other atopic dermatitis: Secondary | ICD-10-CM | POA: Diagnosis not present

## 2017-09-16 ENCOUNTER — Encounter: Payer: Self-pay | Admitting: Internal Medicine

## 2017-09-16 DIAGNOSIS — R21 Rash and other nonspecific skin eruption: Secondary | ICD-10-CM

## 2017-10-25 ENCOUNTER — Encounter: Payer: Self-pay | Admitting: Internal Medicine

## 2017-10-25 ENCOUNTER — Ambulatory Visit (INDEPENDENT_AMBULATORY_CARE_PROVIDER_SITE_OTHER): Payer: No Typology Code available for payment source | Admitting: Internal Medicine

## 2017-10-25 VITALS — BP 118/82 | HR 88 | Temp 98.4°F | Resp 15 | Ht 66.0 in | Wt 196.0 lb

## 2017-10-25 DIAGNOSIS — F900 Attention-deficit hyperactivity disorder, predominantly inattentive type: Secondary | ICD-10-CM | POA: Diagnosis not present

## 2017-10-25 DIAGNOSIS — F411 Generalized anxiety disorder: Secondary | ICD-10-CM | POA: Diagnosis not present

## 2017-10-25 DIAGNOSIS — L209 Atopic dermatitis, unspecified: Secondary | ICD-10-CM | POA: Diagnosis not present

## 2017-10-25 DIAGNOSIS — E559 Vitamin D deficiency, unspecified: Secondary | ICD-10-CM | POA: Diagnosis not present

## 2017-10-25 DIAGNOSIS — E663 Overweight: Secondary | ICD-10-CM

## 2017-10-25 DIAGNOSIS — E782 Mixed hyperlipidemia: Secondary | ICD-10-CM | POA: Diagnosis not present

## 2017-10-25 DIAGNOSIS — E669 Obesity, unspecified: Secondary | ICD-10-CM

## 2017-10-25 DIAGNOSIS — E66811 Obesity, class 1: Secondary | ICD-10-CM

## 2017-10-25 MED ORDER — AMPHETAMINE-DEXTROAMPHET ER 20 MG PO CP24: ORAL_CAPSULE | ORAL | 0 refills | Status: DC

## 2017-10-25 MED ORDER — AMPHETAMINE-DEXTROAMPHETAMINE 10 MG PO TABS: ORAL_TABLET | ORAL | 0 refills | Status: DC

## 2017-10-25 NOTE — Progress Notes (Signed)
Subjective:  Patient ID: Christopher Terrell, male    DOB: 21-Apr-1986  Age: 32 y.o. MRN: [id number]  CC: The primary encounter diagnosis was Vitamin D deficiency. Diagnoses of Elevated triglycerides with high cholesterol, Overweight (BMI 25.0-29.9), Generalized anxiety disorder, Obesity (BMI 30.0-34.9), Attention deficit hyperactivity disorder (ADHD), predominantly inattentive type, and Atopic dermatitis, unspecified type were also pertinent to this visit.  HPI Christopher Terrell presents for FOLLOW UP ON ADD, GAD and obesity  ADD :  He is taking Adderall ,  A total of 30 mg daily in divided doses, and functioning/performing well at work without side effects of insomnia   GAD:  Aggravated by his mother's battle with lung cancer and his father's recent hip surgery.  (Mother is [name]) .  using clonazepam as needed ,  Not more than once daily .  Not combining with alcohol.  OBESITY:  Not exercising regularly.  Discussed his weight gain.  Reviewed diet and work schedule   Body mass index is 31.64 kg/m.   Urticaria:  Being treated for atopic dermatitis with minmal relief,  Still getting sores on arms and legs.  Has a standy by rx for antibitocs for rare infections.    Outpatient Medications Prior to Visit  Medication Sig Dispense Refill  . clonazePAM (KLONOPIN) 0.25 MG disintegrating tablet Take 1 tablet (0.25 mg total) daily as needed by mouth. Keep on file for future refills 30 tablet 5  . hydrOXYzine (ATARAX/VISTARIL) 50 MG tablet Take 1 tablet (50 mg total) by mouth 3 (three) times daily as needed. 30 tablet 0  . propranolol (INDERAL) 10 MG tablet TAKE 1/2 TABLET BY MOUTH 3 TIMES DAILY AS NEEDED 90 tablet 5  . amphetamine-dextroamphetamine (ADDERALL XR) 20 MG 24 hr capsule TAKE 1 CAPSULE BY MOUTH ONCE DAILY 30 capsule 0  . amphetamine-dextroamphetamine (ADDERALL) 10 MG tablet TAKE 1 TABLET BY MOUTH TWICE DAILY WITH MEALS 60 tablet 0  . triamcinolone ointment (KENALOG) 0.1 %   0  .  methylPREDNISolone (MEDROL DOSEPAK) 4 MG TBPK tablet Take Tapered dose as directed (Patient not taking: Reported on 10/25/2017) 21 tablet 0  . predniSONE (STERAPRED UNI-PAK 48 TAB) 10 MG (48) TBPK tablet Take 6 pills for 2 days then decrease by 1 pill every 2 days (Patient not taking: Reported on 08/16/2017) 48 tablet 0   No facility-administered medications prior to visit.     Review of Systems;  Patient denies headache, fevers, malaise, unintentional weight loss, skin rash, eye pain, sinus congestion and sinus pain, sore throat, dysphagia,  hemoptysis , cough, dyspnea, wheezing, chest pain, palpitations, orthopnea, edema, abdominal pain, nausea, melena, diarrhea, constipation, flank pain, dysuria, hematuria, urinary  Frequency, nocturia, numbness, tingling, seizures,  Focal weakness, Loss of consciousness,  Tremor, insomnia, depression, anxiety, and suicidal ideation.      Objective:  BP 118/82 (BP Location: Right Arm, Patient Position: Sitting, Cuff Size: Normal)   Pulse 88   Temp 98.4 F (36.9 C) (Oral)   Resp 15   Ht 5\' 6"  (1.676 m)   Wt 196 lb (88.9 kg)   SpO2 98%   BMI 31.64 kg/m   BP Readings from Last 3 Encounters:  10/25/17 118/82  08/16/17 128/90  07/27/17 120/84    Wt Readings from Last 3 Encounters:  10/25/17 196 lb (88.9 kg)  03/22/17 188 lb 3.2 oz (85.4 kg)  03/16/16 186 lb 6.4 oz (84.6 kg)    General appearance: alert, cooperative and appears stated age Ears: normal TM's and external ear  canals both ears Throat: lips, mucosa, and tongue normal; teeth and gums normal Neck: no adenopathy, no carotid bruit, supple, symmetrical, trachea midline and thyroid not enlarged, symmetric, no tenderness/mass/nodules Back: symmetric, no curvature. ROM normal. No CVA tenderness. Lungs: clear to auscultation bilaterally Heart: regular rate and rhythm, S1, S2 normal, no murmur, click, rub or gallop Abdomen: soft, non-tender; bowel sounds normal; no masses,  no  organomegaly Pulses: 2+ and symmetric Skin: Skin color, texture, turgor normal. No rashes or lesions Lymph nodes: Cervical, supraclavicular, and axillary nodes normal.  No results found for: HGBA1C  Lab Results  Component Value Date   CREATININE 1.09 03/16/2016   CREATININE 1.00 01/11/2015   CREATININE 1.1 04/27/2014   CREATININE 1.12 04/27/2014    Lab Results  Component Value Date   WBC 9.2 01/11/2015   HGB 14.4 01/11/2015   HCT 41.9 01/11/2015   PLT 276.0 01/11/2015   GLUCOSE 105 (H) 03/16/2016   CHOL 188 03/16/2016   TRIG (H) 03/16/2016    520.0 Triglyceride is over 400; calculations on Lipids are invalid.   HDL 40.60 03/16/2016   LDLDIRECT 117.0 03/16/2016   LDLCALC 139 (H) 01/11/2015   ALT 43 03/16/2016   AST 31 03/16/2016   NA 138 03/16/2016   K 4.0 03/16/2016   CL 105 03/16/2016   CREATININE 1.09 03/16/2016   BUN 14 03/16/2016   CO2 26 03/16/2016   TSH 2.84 07/26/2013    No results found.  Assessment & Plan:   Problem List Items Addressed This Visit    Obesity (BMI 30.0-34.9)    I have addressed  BMI and recommended wt loss of 10% of body weight over the next 6 months using a low fat, fruit/vegetable based [place] diet and regular exercise a minimum of 5 days per week.   He has been asked several times to return for fasting labs and has continually deferred due to work schedule. Labs have been ordered AGAIN and I have advised him that future refills of meds will be contingent on his cooperation       Generalized anxiety disorder    Well controlled on current regimen. , no changes today. Clonazepam ODT used prn and refilled .  The risks and benefits of benzodiazepine use were reviewed with patient today including excessive sedation leading to respiratory depression,  impaired thinking/driving, and addiction.  Patient was advised to avoid concurrent use with alcohol, to use medication only as needed and not to share with others  .        Elevated  triglycerides with high cholesterol    He will return for fasting labs this week       Relevant Orders   Lipid panel   Attention deficit hyperactivity disorder (ADHD), predominantly inattentive type    Patient has been given refills for Adderall XR 20 mg and Adderall 10  Mg for 3 months (Feburary ,  March and April)   .   The risks and benefits of this medication have been reviewed with patient, including drug dependence,  increased risk of suicide , syncope, arrhythmia, sudden death, insomnia .      Atopic dermatitis    Recommended use of allegra or claritin , up to 4 times the usual dose may be tried as for management of chronic urticaria       Relevant Orders   CBC with Differential/Platelet    Other Visit Diagnoses    Vitamin D deficiency    -  Primary   Relevant Orders  VITAMIN D 25 Hydroxy (Vit-D Deficiency, Fractures)    A total of 25 minutes of face to face time was spent with patient more than half of which was spent in counselling about the above mentioned conditions  and coordination of care   I have discontinued Khamarion Bjelland. Steinfeldt's predniSONE and methylPREDNISolone. I am also having him maintain his propranolol, clonazePAM, hydrOXYzine, triamcinolone ointment, amphetamine-dextroamphetamine, and amphetamine-dextroamphetamine.  Meds ordered this encounter  Medications  . DISCONTD: amphetamine-dextroamphetamine (ADDERALL XR) 20 MG 24 hr capsule    Sig: TAKE 1 CAPSULE BY MOUTH ONCE DAILY    Dispense:  30 capsule    Refill:  0    MAY REFILL ON OR AFTER Oct 28 2017  . DISCONTD: amphetamine-dextroamphetamine (ADDERALL) 10 MG tablet    Sig: TAKE 1 TABLET BY MOUTH TWICE DAILY WITH MEALS    Dispense:  60 tablet    Refill:  0    MAY REFILL ON OR AFTER November 25 2017  . DISCONTD: amphetamine-dextroamphetamine (ADDERALL XR) 20 MG 24 hr capsule    Sig: TAKE 1 CAPSULE BY MOUTH ONCE DAILY    Dispense:  30 capsule    Refill:  0    MAY REFILL ON OR AFTER November 25 2017  .  DISCONTD: amphetamine-dextroamphetamine (ADDERALL) 10 MG tablet    Sig: TAKE 1 TABLET BY MOUTH TWICE DAILY WITH MEALS    Dispense:  60 tablet    Refill:  0    MAY REFILL ON OR AFTER  October 28 2017  . amphetamine-dextroamphetamine (ADDERALL XR) 20 MG 24 hr capsule    Sig: TAKE 1 CAPSULE BY MOUTH ONCE DAILY    Dispense:  30 capsule    Refill:  0    MAY REFILL ON OR AFTER  December 26 2017  . amphetamine-dextroamphetamine (ADDERALL) 10 MG tablet    Sig: TAKE 1 TABLET BY MOUTH TWICE DAILY WITH MEALS    Dispense:  60 tablet    Refill:  0    MAY REFILL ON OR AFTER  December 26 2017    Medications Discontinued During This Encounter  Medication Reason  . methylPREDNISolone (MEDROL DOSEPAK) 4 MG TBPK tablet Completed Course  . predniSONE (STERAPRED UNI-PAK 48 TAB) 10 MG (48) TBPK tablet Completed Course  . amphetamine-dextroamphetamine (ADDERALL XR) 20 MG 24 hr capsule Reorder  . amphetamine-dextroamphetamine (ADDERALL) 10 MG tablet Reorder  . amphetamine-dextroamphetamine (ADDERALL XR) 20 MG 24 hr capsule Reorder  . amphetamine-dextroamphetamine (ADDERALL) 10 MG tablet Reorder  . amphetamine-dextroamphetamine (ADDERALL XR) 20 MG 24 hr capsule Reorder  . amphetamine-dextroamphetamine (ADDERALL) 10 MG tablet Reorder    Follow-up: Return in about 6 months (around 04/24/2018).   Crecencio Mc, MD

## 2017-10-25 NOTE — Patient Instructions (Signed)
Adderall has been refilled for Feb, march and April   call when you need the following 3 months   For your itching:  Try increasing your claritin gradually to 40 mg daily ( 4 x the usual dose)

## 2017-10-26 ENCOUNTER — Encounter: Payer: Self-pay | Admitting: Internal Medicine

## 2017-10-26 DIAGNOSIS — L309 Dermatitis, unspecified: Secondary | ICD-10-CM | POA: Insufficient documentation

## 2017-10-26 DIAGNOSIS — L209 Atopic dermatitis, unspecified: Secondary | ICD-10-CM

## 2017-10-26 NOTE — Assessment & Plan Note (Signed)
Well controlled on current regimen. , no changes today. Clonazepam ODT used prn and refilled .  The risks and benefits of benzodiazepine use were reviewed with patient today including excessive sedation leading to respiratory depression,  impaired thinking/driving, and addiction.  Patient was advised to avoid concurrent use with alcohol, to use medication only as needed and not to share with others  .

## 2017-10-26 NOTE — Assessment & Plan Note (Signed)
Recommended use of allegra or claritin , up to 4 times the usual dose may be tried as for management of chronic urticaria

## 2017-10-26 NOTE — Assessment & Plan Note (Signed)
He will return for fasting labs this week

## 2017-10-26 NOTE — Assessment & Plan Note (Addendum)
Patient has been given refills for Adderall XR 20 mg and Adderall 10  Mg for 3 months (Feburary ,  March and April)   .   The risks and benefits of this medication have been reviewed with patient, including drug dependence,  increased risk of suicide , syncope, arrhythmia, sudden death, insomnia .

## 2017-10-26 NOTE — Assessment & Plan Note (Addendum)
I have addressed  BMI and recommended wt loss of 10% of body weight over the next 6 months using a low fat, fruit/vegetable based [place] diet and regular exercise a minimum of 5 days per week.   He has been asked several times to return for fasting labs and has continually deferred due to work schedule. Labs have been ordered AGAIN and I have advised him that future refills of meds will be contingent on his cooperation

## 2017-11-03 ENCOUNTER — Other Ambulatory Visit (INDEPENDENT_AMBULATORY_CARE_PROVIDER_SITE_OTHER): Payer: No Typology Code available for payment source

## 2017-11-03 DIAGNOSIS — E559 Vitamin D deficiency, unspecified: Secondary | ICD-10-CM

## 2017-11-03 DIAGNOSIS — E663 Overweight: Secondary | ICD-10-CM | POA: Diagnosis not present

## 2017-11-03 DIAGNOSIS — E782 Mixed hyperlipidemia: Secondary | ICD-10-CM | POA: Diagnosis not present

## 2017-11-03 DIAGNOSIS — L209 Atopic dermatitis, unspecified: Secondary | ICD-10-CM | POA: Diagnosis not present

## 2017-11-03 LAB — COMPREHENSIVE METABOLIC PANEL
ALT: 71 U/L — ABNORMAL HIGH (ref 0–53)
AST: 60 U/L — AB (ref 0–37)
Albumin: 4.1 g/dL (ref 3.5–5.2)
Alkaline Phosphatase: 63 U/L (ref 39–117)
BUN: 14 mg/dL (ref 6–23)
CALCIUM: 9.4 mg/dL (ref 8.4–10.5)
CHLORIDE: 104 meq/L (ref 96–112)
CO2: 27 meq/L (ref 19–32)
CREATININE: 0.92 mg/dL (ref 0.40–1.50)
GFR: 100.44 mL/min (ref >60.00)
Glucose, Bld: 97 mg/dL (ref 70–99)
Potassium: 4.3 mEq/L (ref 3.5–5.1)
Sodium: 138 mEq/L (ref 135–145)
Total Bilirubin: 0.6 mg/dL (ref 0.2–1.2)
Total Protein: 7.3 g/dL (ref 6.0–8.3)

## 2017-11-03 LAB — CBC WITH DIFFERENTIAL/PLATELET
BASOS ABS: 0.1 10*3/uL (ref 0.0–0.1)
BASOS PCT: 1 % (ref 0.0–3.0)
EOS PCT: 1.1 % (ref 0.0–5.0)
Eosinophils Absolute: 0.1 10*3/uL (ref 0.0–0.7)
HEMATOCRIT: 42.9 % (ref 39.0–52.0)
Hemoglobin: 14.7 g/dL (ref 13.0–17.0)
LYMPHS PCT: 22.3 % (ref 12.0–46.0)
Lymphs Abs: 1.9 10*3/uL (ref 0.7–4.0)
MCHC: 34.3 g/dL (ref 30.0–36.0)
MCV: 92 fl (ref 78.0–100.0)
MONOS PCT: 11.6 % (ref 3.0–12.0)
Monocytes Absolute: 1 10*3/uL (ref 0.1–1.0)
NEUTROS ABS: 5.5 10*3/uL (ref 1.4–7.7)
Neutrophils Relative %: 64 % (ref 43.0–77.0)
PLATELETS: 322 10*3/uL (ref 150.0–400.0)
RBC: 4.67 Mil/uL (ref 4.22–5.81)
RDW: 12.8 % (ref 11.5–15.5)
WBC: 8.6 10*3/uL (ref 4.0–10.5)

## 2017-11-03 LAB — LIPID PANEL
CHOL/HDL RATIO: 5
Cholesterol: 201 mg/dL — ABNORMAL HIGH (ref 0–200)
HDL: 41.2 mg/dL (ref >39.00)
LDL CALC: 124 mg/dL — AB (ref 0–99)
NonHDL: 160.13
TRIGLYCERIDES: 180 mg/dL — AB (ref 0.0–149.0)
VLDL: 36 mg/dL (ref 0.0–40.0)

## 2017-11-03 LAB — VITAMIN D 25 HYDROXY (VIT D DEFICIENCY, FRACTURES): VITD: 25.02 ng/mL — ABNORMAL LOW (ref 30.00–100.00)

## 2017-11-05 ENCOUNTER — Other Ambulatory Visit: Payer: Self-pay | Admitting: Internal Medicine

## 2017-11-05 ENCOUNTER — Encounter: Payer: Self-pay | Admitting: Internal Medicine

## 2017-11-05 DIAGNOSIS — K7581 Nonalcoholic steatohepatitis (NASH): Secondary | ICD-10-CM | POA: Insufficient documentation

## 2017-11-05 DIAGNOSIS — K760 Fatty (change of) liver, not elsewhere classified: Secondary | ICD-10-CM | POA: Insufficient documentation

## 2017-11-05 DIAGNOSIS — E559 Vitamin D deficiency, unspecified: Secondary | ICD-10-CM | POA: Insufficient documentation

## 2017-11-05 DIAGNOSIS — R748 Abnormal levels of other serum enzymes: Secondary | ICD-10-CM

## 2017-11-05 MED ORDER — ERGOCALCIFEROL 1.25 MG (50000 UT) PO CAPS
50000.0000 [IU] | ORAL_CAPSULE | ORAL | 0 refills | Status: DC
Start: 2017-11-05 — End: 2019-03-29

## 2017-11-12 MED FILL — DUPIXENT 300 MG/2 ML SAFE S: 300 | 1 days supply | Qty: 4 | Fill #0

## 2017-11-17 ENCOUNTER — Telehealth: Payer: Self-pay | Admitting: Pharmacist

## 2017-11-17 NOTE — Telephone Encounter (Signed)
Called patient to schedule an appointment for the [facility]. I was unable to reach the patient so I left a HIPAA-compliant message requesting that the patient return my call.   

## 2017-11-23 ENCOUNTER — Encounter: Payer: Self-pay | Admitting: Pharmacist

## 2017-11-24 ENCOUNTER — Encounter: Payer: Self-pay | Admitting: Pharmacist

## 2017-11-24 ENCOUNTER — Telehealth (HOSPITAL_BASED_OUTPATIENT_CLINIC_OR_DEPARTMENT_OTHER): Payer: No Typology Code available for payment source | Admitting: Pharmacist

## 2017-11-24 DIAGNOSIS — Z79899 Other long term (current) drug therapy: Secondary | ICD-10-CM

## 2017-11-24 MED ORDER — DUPILUMAB 300 MG/2ML ~~LOC~~ SOSY: 300.0000 mg | PREFILLED_SYRINGE | SUBCUTANEOUS | 11 refills | Status: DC

## 2017-11-24 NOTE — Video Visit (Addendum)
Session Summary Report for Virtual Visit using the [employer], occurred on Date [id number] with Provider [facility] event:join, time:20:09, mrn: [id number] event:leave, time:20:10, mrn: [id number] event:join, time:20:10, mrn: [id number] event:join, time:20:10, Health Sys: [id number] event:leave, time:20:11, mrn: [id number] event:join, time:20:11, mrn: [id number] event:leave, time:20:12, Health Sys: [id number] event:leave, time:20:18, mrn: [id number] event:end, time:20:30 event:end, time:20:40

## 2017-11-24 NOTE — Progress Notes (Signed)
   S: Patient presents to [facility] for review of their specialty medication therapy.  Patient is currently taking Dupixent for atopic dermatitis . Patient is managed by [facility] for this.   Adherence: has not started yet, just picked up the medication yesterday.  Efficacy: has not started yet  Dosing: 600 mg x 1, then 300 mg every 2 weeks  Monitoring: S/sx of infection: denies Hypersensitivity reaction: has not started Ocular effects: has not started S/sx of eosinophilia/vasculitis: has not started  O:     Lab Results  Component Value Date   WBC 8.6 11/03/2017   HGB 14.7 11/03/2017   HCT 42.9 11/03/2017   MCV 92.0 11/03/2017   PLT 322.0 11/03/2017      Chemistry      Component Value Date/Time   NA 138 11/03/2017 0953   NA 138 04/27/2014 0741   NA 138 04/27/2014 0741   K 4.3 11/03/2017 0953   K 4.1 04/27/2014 0741   CL 104 11/03/2017 0953   CL 104 04/27/2014 0741   CO2 27 11/03/2017 0953   CO2 26 04/27/2014 0741   BUN 14 11/03/2017 0953   BUN 19 04/27/2014 0741   BUN 19 (H) 04/27/2014 0741   CREATININE 0.92 11/03/2017 0953   CREATININE 1.12 04/27/2014 0741   GLU 102 04/27/2014 0741      Component Value Date/Time   CALCIUM 9.4 11/03/2017 0953   CALCIUM 8.7 04/27/2014 0741   ALKPHOS 63 11/03/2017 0953   ALKPHOS 78 04/27/2014 0741   AST 60 (H) 11/03/2017 0953   AST 25 04/27/2014 0741   ALT 71 (H) 11/03/2017 0953   ALT 40 04/27/2014 0741   BILITOT 0.6 11/03/2017 0953   BILITOT 0.6 04/27/2014 0741       A/P: 1. Medication review: Patient currently on [place] for atopic dermatitis and has not started it yet. Reviewed medication with patient, including the following: Dupixent is a monoclonal antibody used for atopic dermatitis and asthma. Possible adverse effects include increased risk of infection, ocular effects, injection site reactions, and eosinophilia/vasculitis. No recommendations for any changes.   Christella Hartigan, PharmD, BCPS, BCACP, [facility] [phone]

## 2017-11-29 MED FILL — DUPIXENT 300 MG/2 ML SAFE S: 300 | 28 days supply | Qty: 4 | Fill #0

## 2017-12-24 ENCOUNTER — Encounter: Payer: Self-pay | Admitting: Internal Medicine

## 2017-12-24 DIAGNOSIS — Z0289 Encounter for other administrative examinations: Secondary | ICD-10-CM

## 2017-12-27 ENCOUNTER — Encounter: Payer: Self-pay | Admitting: Internal Medicine

## 2017-12-27 ENCOUNTER — Ambulatory Visit (INDEPENDENT_AMBULATORY_CARE_PROVIDER_SITE_OTHER): Payer: No Typology Code available for payment source | Admitting: Internal Medicine

## 2017-12-27 DIAGNOSIS — R030 Elevated blood-pressure reading, without diagnosis of hypertension: Secondary | ICD-10-CM

## 2017-12-27 DIAGNOSIS — E669 Obesity, unspecified: Secondary | ICD-10-CM

## 2017-12-27 DIAGNOSIS — F411 Generalized anxiety disorder: Secondary | ICD-10-CM | POA: Diagnosis not present

## 2017-12-27 DIAGNOSIS — F900 Attention-deficit hyperactivity disorder, predominantly inattentive type: Secondary | ICD-10-CM | POA: Diagnosis not present

## 2017-12-27 DIAGNOSIS — R748 Abnormal levels of other serum enzymes: Secondary | ICD-10-CM

## 2017-12-27 DIAGNOSIS — E782 Mixed hyperlipidemia: Secondary | ICD-10-CM

## 2017-12-27 DIAGNOSIS — Z0000 Encounter for general adult medical examination without abnormal findings: Secondary | ICD-10-CM

## 2017-12-27 DIAGNOSIS — L209 Atopic dermatitis, unspecified: Secondary | ICD-10-CM | POA: Diagnosis not present

## 2017-12-27 MED ORDER — AMPHETAMINE-DEXTROAMPHET ER 20 MG PO CP24: ORAL_CAPSULE | ORAL | 0 refills | Status: DC

## 2017-12-27 MED ORDER — FEXOFENADINE HCL 180 MG PO TABS: 180.0000 mg | ORAL_TABLET | Freq: Every day | ORAL | 1 refills | Status: DC

## 2017-12-27 MED ORDER — MONTELUKAST SODIUM 10 MG PO TABS: 10.0000 mg | ORAL_TABLET | Freq: Every day | ORAL | 3 refills | Status: DC

## 2017-12-27 MED ORDER — AMPHETAMINE-DEXTROAMPHETAMINE 10 MG PO TABS: ORAL_TABLET | ORAL | 0 refills | Status: DC

## 2017-12-27 MED ORDER — AMPHETAMINE-DEXTROAMPHET ER 20 MG PO CP24
ORAL_CAPSULE | ORAL | 0 refills | Status: DC
Start: 2017-12-27 — End: 2017-12-27

## 2017-12-27 NOTE — Addendum Note (Signed)
Addended by: Arby Barrette on: 12/27/2017 02:03 PM   Modules accepted: Orders

## 2017-12-27 NOTE — Assessment & Plan Note (Addendum)
Annual comprehensive preventive exam was done as well as an evaluation and management of chronic conditions .  During the course of the visit the patient was educated and counseled about appropriate screening and preventive services including :  diabetes screening, lipid analysis with projected  10 year  risk for CAD at 7%  , nutrition counseling, breast, cervical and colorectal cancer screening, and recommended immunizations.  Printed recommendations for health maintenance screenings was given

## 2017-12-27 NOTE — Patient Instructions (Signed)

## 2017-12-27 NOTE — Progress Notes (Signed)
Patient ID: Christopher Terrell, male    DOB: 16-Aug-1986  Age: 32 y.o. MRN: [id number]  The patient is here for annual preventive  examination and management of other chronic and acute problems.  Last seen one year ago .  Did not return for elevated LFTs labs   Sees Dermatology ([facility]) for atopic dermatitis.  Symptoms improving on current therapy   The risk factors are reflected in the social history.  The roster of all physicians providing medical care to patient - is listed in the Snapshot section of the chart.  Activities of daily living:  The patient is 100% independent in all ADLs: dressing, toileting, feeding as well as independent mobility  Home safety : The patient has smoke detectors in the home. They wear seatbelts.  There are no firearms at home. There is no violence in the home.   There is no risks for hepatitis, STDs or HIV. There is no   history of blood transfusion. They have no travel history to infectious disease endemic areas of the world.  The patient has seen their dentist in the last six month. They have seen their eye doctor in the last year. They deny hearing difficulty with regard to whispered voices and some television programs.    hey do not  have excessive sun exposure. Discussed the need for sun protection: hats, long sleeves and use of sunscreen if there is significant sun exposure.   Diet: the importance of a healthy diet is discussed. He has an average diet,  Eats fast food 2-3 times per week.   The benefits of regular aerobic exercise were discussed. He is not exercising regularly.   Depression screen: there are no signs or vegative symptoms of depression- irritability, change in appetite, anhedonia, sadness/tearfullness.  Cognitive assessment: the patient manages all their financial and personal affairs and is actively engaged. They could relate day,date,year and events; recalled 2/3 objects at 3 minutes; performed clock-face test normally.  The following  portions of the patient's history were reviewed and updated as appropriate: allergies, current medications, past family history, past medical history,  past surgical history, past social history  and problem list.  Visual acuity was not assessed per patient preference since she has regular follow up with her ophthalmologist. Hearing and body mass index were assessed and reviewed.   During the course of the visit the patient was educated and counseled about appropriate screening and preventive services including : fall prevention , diabetes screening, nutrition counseling, colorectal cancer screening, and recommended immunizations.    CC: Diagnoses of Encounter for wellness examination, Elevated liver enzymes, Obesity (BMI 30.0-34.9), Elevated blood pressure reading without diagnosis of hypertension, Atopic dermatitis, unspecified type, Generalized anxiety disorder, Attention deficit hyperactivity disorder (ADHD), predominantly inattentive type, and Elevated triglycerides with high cholesterol were pertinent to this visit.  History Christopher Terrell has no past medical history on file.   He has a past surgical history that includes Small intestine surgery and Hernia repair.   His family history includes Arthritis in his mother; Heart disease in his maternal grandfather and maternal grandmother.He reports that he quit smoking about 6 years ago. He has never used smokeless tobacco. He reports that he drinks about 4.2 oz of alcohol per week. He reports that he does not use drugs.  Outpatient Medications Prior to Visit  Medication Sig Dispense Refill  . clonazePAM (KLONOPIN) 0.25 MG disintegrating tablet Take 1 tablet (0.25 mg total) daily as needed by mouth. Keep on file for future refills 30 tablet  5  . Dupilumab (DUPIXENT) 300 MG/2ML SOSY Inject 300 mg into the skin every 14 (fourteen) days. Starting on day 15 as directed 4 mL 11  . ergocalciferol (DRISDOL) 50000 units capsule Take 1 capsule (50,000 Units  total) by mouth once a week. 12 capsule 0  . propranolol (INDERAL) 10 MG tablet TAKE 1/2 TABLET BY MOUTH 3 TIMES DAILY AS NEEDED 90 tablet 5  . triamcinolone ointment (KENALOG) 0.1 %   0  . amphetamine-dextroamphetamine (ADDERALL XR) 20 MG 24 hr capsule TAKE 1 CAPSULE BY MOUTH ONCE DAILY 30 capsule 0  . amphetamine-dextroamphetamine (ADDERALL) 10 MG tablet TAKE 1 TABLET BY MOUTH TWICE DAILY WITH MEALS 60 tablet 0  . hydrOXYzine (ATARAX/VISTARIL) 50 MG tablet Take 1 tablet (50 mg total) by mouth 3 (three) times daily as needed. (Patient not taking: Reported on 12/27/2017) 30 tablet 0   No facility-administered medications prior to visit.     Review of Systems   Patient denies headache, fevers, malaise, unintentional weight loss, skin rash, eye pain, sinus congestion and sinus pain, sore throat, dysphagia,  hemoptysis , cough, dyspnea, wheezing, chest pain, palpitations, orthopnea, edema, abdominal pain, nausea, melena, diarrhea, constipation, flank pain, dysuria, hematuria, urinary  Frequency, nocturia, numbness, tingling, seizures,  Focal weakness, Loss of consciousness,  Tremor, insomnia, depression, anxiety, and suicidal ideation.      Objective:  BP (!) 130/94 (BP Location: Left Arm, Patient Position: Sitting, Cuff Size: Normal)   Pulse 91   Temp 98.6 F (37 C) (Oral)   Resp 15   Ht 5\' 6"  (1.676 m)   Wt 198 lb 9.6 oz (90.1 kg)   SpO2 98%   BMI 32.05 kg/m   Physical Exam   General appearance: alert, cooperative and appears stated age Ears: normal TM's and external ear canals both ears Throat: lips, mucosa, and tongue normal; teeth and gums normal Neck: no adenopathy, no carotid bruit, supple, symmetrical, trachea midline and thyroid not enlarged, symmetric, no tenderness/mass/nodules Back: symmetric, no curvature. ROM normal. No CVA tenderness. Lungs: clear to auscultation bilaterally Heart: regular rate and rhythm, S1, S2 normal, no murmur, click, rub or gallop Abdomen: soft,  non-tender; bowel sounds normal; no masses,  no organomegaly Pulses: 2+ and symmetric Skin: improving diffuse rash on arms, knees.  Skin color, texture, turgor normal.  Lymph nodes: Cervical, supraclavicular, and axillary nodes normal.    Assessment & Plan:   Problem List Items Addressed This Visit    Obesity (BMI 30.0-34.9)    I have addressed  BMI and recommended wt loss of 10% of body weigh over the next 6 months using a low glycemic index diet and regular exercise a minimum of 5 days per week.        Generalized anxiety disorder    Well controlled on current regimen. , no changes today. Clonazepam ODT used prn and refilled .  The risks and benefits of benzodiazepine use were reviewed with patient today including excessive sedation leading to respiratory depression,  impaired thinking/driving, and addiction.  Patient was advised to avoid concurrent use with alcohol, to use medication only as needed and not to share with others  .        Encounter for wellness examination    Annual comprehensive preventive exam was done as well as an evaluation and management of chronic conditions .  During the course of the visit the patient was educated and counseled about appropriate screening and preventive services including :  diabetes screening, lipid analysis with projected  10 year  risk for CAD at 7%  , nutrition counseling, breast, cervical and colorectal cancer screening, and recommended immunizations.  Printed recommendations for health maintenance screenings was given      Elevated triglycerides with high cholesterol    Improved with repeat fasting panel  Encouraged to exercise and follow a low GI diet.   Lab Results  Component Value Date   CHOL 201 (H) 11/03/2017   HDL 41.20 11/03/2017   LDLCALC 124 (H) 11/03/2017   LDLDIRECT 117.0 03/16/2016   TRIG 180.0 (H) 11/03/2017   CHOLHDL 5 11/03/2017         Elevated liver enzymes    Transient, noted at last visit,  Now resolved.  No  workup unless recurrent fatty liver suspected given metabolic syndrome  Lab Results  Component Value Date   ALT 46 12/27/2017   AST 33 12/27/2017   ALKPHOS 63 11/03/2017   BILITOT 0.6 12/27/2017         Elevated blood pressure reading without diagnosis of hypertension    Attributed to use of Adderall for ADD .  He has measured his BP at home and readings are > 130/80.  continue inderal.       Attention deficit hyperactivity disorder (ADHD), predominantly inattentive type    Patient has been given refills for Adderall XR 20 mg and Adderall 10  Mg for 3 months (May , Juen July and AUGUST l)   .   The risks and benefits of this medication have been reviewed with patient, including drug dependence,  increased risk of suicide , syncope, arrhythmia, sudden death, insomnia .      Atopic dermatitis    Managed by Dermatology with injectible immunotherapeutic agent  Dupilumab eevry 14 days.  States that he gets 10 days of relief from itching.  Trial of additive therapy with allegra and singulair.          I am having Christopher Terrell. Costa Terrell start on montelukast and fexofenadine. I am also having him maintain his propranolol, clonazePAM, hydrOXYzine, triamcinolone ointment, ergocalciferol, Dupilumab, amphetamine-dextroamphetamine, amphetamine-dextroamphetamine, amphetamine-dextroamphetamine, amphetamine-dextroamphetamine, and amphetamine-dextroamphetamine.  Meds ordered this encounter  Medications  . montelukast (SINGULAIR) 10 MG tablet    Sig: Take 1 tablet (10 mg total) by mouth at bedtime.    Dispense:  30 tablet    Refill:  3  . fexofenadine (ALLEGRA) 180 MG tablet    Sig: Take 1 tablet (180 mg total) by mouth daily.    Dispense:  30 tablet    Refill:  1  . DISCONTD: amphetamine-dextroamphetamine (ADDERALL XR) 20 MG 24 hr capsule    Sig: TAKE 1 CAPSULE BY MOUTH ONCE DAILY    Dispense:  30 capsule    Refill:  0    MAY REFILL ON OR AFTER Jan 25 2018  . DISCONTD:  amphetamine-dextroamphetamine (ADDERALL XR) 20 MG 24 hr capsule    Sig: TAKE 1 CAPSULE BY MOUTH ONCE DAILY    Dispense:  30 capsule    Refill:  0    MAY REFILL ON OR AFTER February 25 2018  . DISCONTD: amphetamine-dextroamphetamine (ADDERALL XR) 20 MG 24 hr capsule    Sig: TAKE 1 CAPSULE BY MOUTH ONCE DAILY    Dispense:  30 capsule    Refill:  0    MAY REFILL ON OR AFTER March 27 2018  . DISCONTD: amphetamine-dextroamphetamine (ADDERALL) 10 MG tablet    Sig: TAKE 1 TABLET BY MOUTH TWICE DAILY WITH MEALS    Dispense:  60 tablet  Refill:  0    MAY REFILL ON OR AFTER  Jan 25 2018  . amphetamine-dextroamphetamine (ADDERALL XR) 20 MG 24 hr capsule    Sig: TAKE 1 CAPSULE BY MOUTH ONCE DAILY    Dispense:  30 capsule    Refill:  0    MAY REFILL ON OR AFTER March 27 2018  . amphetamine-dextroamphetamine (ADDERALL XR) 20 MG 24 hr capsule    Sig: TAKE 1 CAPSULE BY MOUTH ONCE DAILY    Dispense:  30 capsule    Refill:  0    MAY REFILL ON OR AFTER April 27 2018  . amphetamine-dextroamphetamine (ADDERALL XR) 20 MG 24 hr capsule    Sig: TAKE 1 CAPSULE BY MOUTH ONCE DAILY    Dispense:  30 capsule    Refill:  0    MAY REFILL ON OR AFTER April 27 2018  . DISCONTD: amphetamine-dextroamphetamine (ADDERALL) 10 MG tablet    Sig: TAKE 1 TABLET BY MOUTH TWICE DAILY WITH MEALS    Dispense:  60 tablet    Refill:  0    MAY REFILL ON OR AFTER  February 25 2018  . amphetamine-dextroamphetamine (ADDERALL) 10 MG tablet    Sig: TAKE 1 TABLET BY MOUTH TWICE DAILY WITH MEALS    Dispense:  60 tablet    Refill:  0    MAY REFILL ON OR AFTER  March 27 2018  . amphetamine-dextroamphetamine (ADDERALL) 10 MG tablet    Sig: TAKE 1 TABLET BY MOUTH TWICE DAILY WITH MEALS    Dispense:  60 tablet    Refill:  0    MAY REFILL ON OR AFTER  April 27 2018    Medications Discontinued During This Encounter  Medication Reason  . amphetamine-dextroamphetamine (ADDERALL XR) 20 MG 24 hr capsule Reorder  . amphetamine-dextroamphetamine  (ADDERALL XR) 20 MG 24 hr capsule Reorder  . amphetamine-dextroamphetamine (ADDERALL XR) 20 MG 24 hr capsule Reorder  . amphetamine-dextroamphetamine (ADDERALL) 10 MG tablet Reorder  . amphetamine-dextroamphetamine (ADDERALL XR) 20 MG 24 hr capsule Reorder  . amphetamine-dextroamphetamine (ADDERALL) 10 MG tablet Reorder  . amphetamine-dextroamphetamine (ADDERALL) 10 MG tablet Reorder    Follow-up: No follow-ups on file.   Crecencio Mc, MD

## 2017-12-28 NOTE — Assessment & Plan Note (Signed)
Improved with repeat fasting panel  Encouraged to exercise and follow a low GI diet.   Lab Results  Component Value Date   CHOL 201 (H) 11/03/2017   HDL 41.20 11/03/2017   LDLCALC 124 (H) 11/03/2017   LDLDIRECT 117.0 03/16/2016   TRIG 180.0 (H) 11/03/2017   CHOLHDL 5 11/03/2017

## 2017-12-28 NOTE — Assessment & Plan Note (Signed)
Attributed to use of Adderall for ADD .  He has measured his BP at home and readings are > 130/80.  continue inderal.

## 2017-12-28 NOTE — Assessment & Plan Note (Signed)
I have addressed  BMI and recommended wt loss of 10% of body weigh over the next 6 months using a low glycemic index diet and regular exercise a minimum of 5 days per week.   

## 2017-12-28 NOTE — Assessment & Plan Note (Signed)
Patient has been given refills for Adderall XR 20 mg and Adderall 10  Mg for 3 months (May , Juen July and AUGUST l)   .   The risks and benefits of this medication have been reviewed with patient, including drug dependence,  increased risk of suicide , syncope, arrhythmia, sudden death, insomnia .

## 2017-12-28 NOTE — Assessment & Plan Note (Signed)
Managed by Dermatology with injectible immunotherapeutic agent  Dupilumab eevry 14 days.  States that he gets 10 days of relief from itching.  Trial of additive therapy with allegra and singulair.

## 2017-12-28 NOTE — Assessment & Plan Note (Signed)
Well controlled on current regimen. , no changes today. Clonazepam ODT used prn and refilled .  The risks and benefits of benzodiazepine use were reviewed with patient today including excessive sedation leading to respiratory depression,  impaired thinking/driving, and addiction.  Patient was advised to avoid concurrent use with alcohol, to use medication only as needed and not to share with others  .

## 2017-12-28 NOTE — Assessment & Plan Note (Addendum)
Transient, noted at last visit,  Now resolved.  No workup unless recurrent fatty liver suspected given metabolic syndrome  Lab Results  Component Value Date   ALT 46 12/27/2017   AST 33 12/27/2017   ALKPHOS 63 11/03/2017   BILITOT 0.6 12/27/2017

## 2017-12-31 LAB — HEPATIC FUNCTION PANEL
AG Ratio: 1.5 (calc) (ref 1.0–2.5)
ALKALINE PHOSPHATASE (APISO): 76 U/L (ref 40–115)
ALT: 46 U/L (ref 9–46)
AST: 33 U/L (ref 10–40)
Albumin: 4.6 g/dL (ref 3.6–5.1)
BILIRUBIN DIRECT: 0.1 mg/dL (ref 0.0–0.2)
BILIRUBIN INDIRECT: 0.5 mg/dL (ref 0.2–1.2)
BILIRUBIN TOTAL: 0.6 mg/dL (ref 0.2–1.2)
Globulin: 3.1 g/dL (calc) (ref 1.9–3.7)
Total Protein: 7.7 g/dL (ref 6.1–8.1)

## 2017-12-31 LAB — HEPATITIS B SURFACE ANTIGEN: Hepatitis B Surface Ag: NONREACTIVE

## 2017-12-31 LAB — HEPATITIS C ANTIBODY
HEP C AB: NONREACTIVE
SIGNAL TO CUT-OFF: 0.01 (ref <1.00)

## 2017-12-31 LAB — ANA: Anti Nuclear Antibody(ANA): NEGATIVE

## 2017-12-31 LAB — ANTI-SMITH ANTIBODY: ENA SM Ab Ser-aCnc: <1 AI

## 2017-12-31 LAB — HEPATITIS B CORE ANTIBODY, TOTAL: HEP B C TOTAL AB: NONREACTIVE

## 2017-12-31 LAB — MITOCHONDRIAL ANTIBODIES

## 2017-12-31 LAB — FERRITIN: Ferritin: 192 ng/mL (ref 20–345)

## 2017-12-31 MED FILL — DUPIXENT 300 MG/2 ML SAFE S: 300 | 28 days supply | Qty: 4 | Fill #1

## 2018-01-24 MED FILL — DUPIXENT 300 MG/2 ML SAFE S: 300 | 28 days supply | Qty: 4 | Fill #2

## 2018-02-25 MED FILL — DUPIXENT 300 MG/2 ML SAFE S: 300 | 28 days supply | Qty: 4 | Fill #3

## 2018-03-28 MED FILL — DUPIXENT 300 MG/2 ML SAFE S: 300 | 28 days supply | Qty: 4 | Fill #4

## 2018-04-07 ENCOUNTER — Other Ambulatory Visit: Payer: Self-pay | Admitting: Internal Medicine

## 2018-04-25 ENCOUNTER — Other Ambulatory Visit: Payer: Self-pay

## 2018-04-25 ENCOUNTER — Ambulatory Visit (INDEPENDENT_AMBULATORY_CARE_PROVIDER_SITE_OTHER): Payer: No Typology Code available for payment source | Admitting: Internal Medicine

## 2018-04-25 ENCOUNTER — Ambulatory Visit: Payer: Self-pay | Admitting: Internal Medicine

## 2018-04-25 DIAGNOSIS — F900 Attention-deficit hyperactivity disorder, predominantly inattentive type: Secondary | ICD-10-CM

## 2018-04-25 DIAGNOSIS — I10 Essential (primary) hypertension: Secondary | ICD-10-CM

## 2018-04-25 MED ORDER — CLONAZEPAM 0.25 MG PO TBDP: 0.2500 mg | ORAL_TABLET | Freq: Every day | ORAL | 5 refills | Status: DC | PRN

## 2018-04-25 MED ORDER — AMPHETAMINE-DEXTROAMPHETAMINE 10 MG PO TABS: ORAL_TABLET | ORAL | 0 refills | Status: DC

## 2018-04-25 MED ORDER — AMPHETAMINE-DEXTROAMPHET ER 20 MG PO CP24: ORAL_CAPSULE | ORAL | 0 refills | Status: DC

## 2018-04-25 MED ORDER — AMPHETAMINE-DEXTROAMPHET ER 20 MG PO CP24
ORAL_CAPSULE | ORAL | 0 refills | Status: DC
Start: 2018-04-25 — End: 2018-04-25

## 2018-04-25 MED ORDER — METOPROLOL SUCCINATE ER 50 MG PO TB24: 50.0000 mg | ORAL_TABLET | Freq: Every day | ORAL | 3 refills | Status: DC

## 2018-04-25 NOTE — Progress Notes (Signed)
Subjective:  Patient ID: Christopher Terrell, male    DOB: Mar 06, 1986  Age: 32 y.o. MRN: [id number]  CC: Diagnoses of Attention deficit hyperactivity disorder (ADHD), predominantly inattentive type and Essential hypertension were pertinent to this visit.  HPI Christopher Terrell presents for medication refill on 6 month follow up on ADD managed with Adderal XR and Adderall   He is very irritable today , because he has to go back to work.  He is not exercising and has gained weight due to increased work hours dictated by his employer  Hypertension  Taking low dose of inderal for svt  . Has not had BP checked since last visit.      Outpatient Medications Prior to Visit  Medication Sig Dispense Refill  . amphetamine-dextroamphetamine (ADDERALL XR) 20 MG 24 hr capsule TAKE 1 CAPSULE BY MOUTH ONCE DAILY 30 capsule 0  . amphetamine-dextroamphetamine (ADDERALL XR) 20 MG 24 hr capsule TAKE 1 CAPSULE BY MOUTH ONCE DAILY 30 capsule 0  . Dupilumab (DUPIXENT) 300 MG/2ML SOSY Inject 300 mg into the skin every 14 (fourteen) days. Starting on day 15 as directed 4 mL 11  . hydrOXYzine (ATARAX/VISTARIL) 50 MG tablet Take 1 tablet (50 mg total) by mouth 3 (three) times daily as needed. 30 tablet 0  . montelukast (SINGULAIR) 10 MG tablet Take 1 tablet (10 mg total) by mouth at bedtime. 30 tablet 3  . propranolol (INDERAL) 10 MG tablet TAKE 1/2 TABLET BY MOUTH 3 TIMES DAILY AS NEEDED 90 tablet 5  . triamcinolone ointment (KENALOG) 0.1 %   0  . amphetamine-dextroamphetamine (ADDERALL XR) 20 MG 24 hr capsule TAKE 1 CAPSULE BY MOUTH ONCE DAILY 30 capsule 0  . amphetamine-dextroamphetamine (ADDERALL) 10 MG tablet TAKE 1 TABLET BY MOUTH TWICE DAILY WITH MEALS 60 tablet 0  . amphetamine-dextroamphetamine (ADDERALL) 10 MG tablet TAKE 1 TABLET BY MOUTH TWICE DAILY WITH MEALS 60 tablet 0  . clonazePAM (KLONOPIN) 0.25 MG disintegrating tablet Take 1 tablet (0.25 mg total) daily as needed by mouth. Keep on file for future  refills 30 tablet 5  . ergocalciferol (DRISDOL) 50000 units capsule Take 1 capsule (50,000 Units total) by mouth once a week. (Patient not taking: Reported on 04/25/2018) 12 capsule 0  . fexofenadine (ALLEGRA) 180 MG tablet Take 1 tablet (180 mg total) by mouth daily. (Patient not taking: Reported on 04/25/2018) 30 tablet 1   No facility-administered medications prior to visit.     Review of Systems;  Patient denies headache, fevers, malaise, unintentional weight loss, skin rash, eye pain, sinus congestion and sinus pain, sore throat, dysphagia,  hemoptysis , cough, dyspnea, wheezing, chest pain, palpitations, orthopnea, edema, abdominal pain, nausea, melena, diarrhea, constipation, flank pain, dysuria, hematuria, urinary  Frequency, nocturia, numbness, tingling, seizures,  Focal weakness, Loss of consciousness,  Tremor, insomnia, depression, anxiety, and suicidal ideation.      Objective:  BP (!) 118/96 (BP Location: Left Arm, Patient Position: Sitting, Cuff Size: Normal)   Pulse 81   Temp 98.1 F (36.7 C) (Oral)   Wt 205 lb 3.2 oz (93.1 kg)   SpO2 96%   BMI 33.12 kg/m   BP Readings from Last 3 Encounters:  04/25/18 (!) 118/96  12/27/17 (!) 130/94  10/25/17 118/82    Wt Readings from Last 3 Encounters:  04/25/18 205 lb 3.2 oz (93.1 kg)  12/27/17 198 lb 9.6 oz (90.1 kg)  10/25/17 196 lb (88.9 kg)    General appearance: alert, cooperative and appears stated age Ears:  normal TM's and external ear canals both ears Throat: lips, mucosa, and tongue normal; teeth and gums normal Neck: no adenopathy, no carotid bruit, supple, symmetrical, trachea midline and thyroid not enlarged, symmetric, no tenderness/mass/nodules Back: symmetric, no curvature. ROM normal. No CVA tenderness. Lungs: clear to auscultation bilaterally Heart: regular rate and rhythm, S1, S2 normal, no murmur, click, rub or gallop Abdomen: soft, non-tender; bowel sounds normal; no masses,  no organomegaly Pulses: 2+ and  symmetric Skin: Skin color, texture, turgor normal. No rashes or lesions Lymph nodes: Cervical, supraclavicular, and axillary nodes normal.  No results found for: HGBA1C  Lab Results  Component Value Date   CREATININE 0.92 11/03/2017   CREATININE 1.09 03/16/2016   CREATININE 1.00 01/11/2015    Lab Results  Component Value Date   WBC 8.6 11/03/2017   HGB 14.7 11/03/2017   HCT 42.9 11/03/2017   PLT 322.0 11/03/2017   GLUCOSE 97 11/03/2017   CHOL 201 (H) 11/03/2017   TRIG 180.0 (H) 11/03/2017   HDL 41.20 11/03/2017   LDLDIRECT 117.0 03/16/2016   LDLCALC 124 (H) 11/03/2017   ALT 46 12/27/2017   AST 33 12/27/2017   NA 138 11/03/2017   K 4.3 11/03/2017   CL 104 11/03/2017   CREATININE 0.92 11/03/2017   BUN 14 11/03/2017   CO2 27 11/03/2017   TSH 2.84 07/26/2013    No results found.  Assessment & Plan:   Problem List Items Addressed This Visit    Attention deficit hyperactivity disorder (ADHD), predominantly inattentive type    Patient has been given refills for Adderall XR 20 mg and Adderall 10  Mg for 3 months (August, September and October)   .   The risks and benefits of this medication have been reviewed with patient, including drug dependence,  increased risk of suicide , syncope, arrhythmia, sudden death, insomnia .      Hypertension    Starting Toprol XL 50 mg daily,  Stopping inderal.       Relevant Medications   metoprolol succinate (TOPROL-XL) 50 MG 24 hr tablet     A total of 25 minutes of face to face time was spent with patient more than half of which was spent in counselling about the above mentioned conditions  and coordination of care  I have changed Christopher Terrell. Christopher Terrell clonazePAM. I am also having him start on metoprolol succinate. Additionally, I am having him maintain his hydrOXYzine, triamcinolone ointment, ergocalciferol, Dupilumab, montelukast, fexofenadine, amphetamine-dextroamphetamine, amphetamine-dextroamphetamine, propranolol,  amphetamine-dextroamphetamine, amphetamine-dextroamphetamine, amphetamine-dextroamphetamine, and amphetamine-dextroamphetamine.  Meds ordered this encounter  Medications  . metoprolol succinate (TOPROL-XL) 50 MG 24 hr tablet    Sig: Take 1 tablet (50 mg total) by mouth daily. Take with or immediately following a meal.    Dispense:  90 tablet    Refill:  3  . clonazePAM (KLONOPIN) 0.25 MG disintegrating tablet    Sig: Take 1 tablet (0.25 mg total) by mouth daily as needed. Keep on file for future refills    Dispense:  30 tablet    Refill:  5  . DISCONTD: amphetamine-dextroamphetamine (ADDERALL) 10 MG tablet    Sig: TAKE 1 TABLET BY MOUTH TWICE DAILY WITH MEALS    Dispense:  60 tablet    Refill:  0    MAY REFILL ON OR AFTER  April 26 2018  . DISCONTD: amphetamine-dextroamphetamine (ADDERALL) 10 MG tablet    Sig: TAKE 1 TABLET BY MOUTH TWICE DAILY WITH MEALS    Dispense:  60 tablet  Refill:  0    MAY REFILL ON OR AFTER  May 26 2018  . amphetamine-dextroamphetamine (ADDERALL) 10 MG tablet    Sig: TAKE 1 TABLET BY MOUTH TWICE DAILY WITH MEALS    Dispense:  60 tablet    Refill:  0    MAY REFILL ON OR AFTER  June 26 2018  . DISCONTD: amphetamine-dextroamphetamine (ADDERALL XR) 20 MG 24 hr capsule    Sig: TAKE 1 CAPSULE BY MOUTH ONCE DAILY    Dispense:  30 capsule    Refill:  0    MAY REFILL ON OR AFTER May 27 2018  . amphetamine-dextroamphetamine (ADDERALL XR) 20 MG 24 hr capsule    Sig: TAKE 1 CAPSULE BY MOUTH ONCE DAILY    Dispense:  30 capsule    Refill:  0    MAY REFILL ON OR AFTER June 26 2018  . amphetamine-dextroamphetamine (ADDERALL XR) 20 MG 24 hr capsule    Sig: TAKE 1 CAPSULE BY MOUTH ONCE DAILY    Dispense:  30 capsule    Refill:  0    MAY REFILL ON OR AFTER April 27 2018  . amphetamine-dextroamphetamine (ADDERALL) 10 MG tablet    Sig: TAKE 1 TABLET BY MOUTH TWICE DAILY WITH MEALS    Dispense:  60 tablet    Refill:  0    MAY REFILL ON OR AFTER  June 25 2018    Medications Discontinued During This Encounter  Medication Reason  . clonazePAM (KLONOPIN) 0.25 MG disintegrating tablet Reorder  . amphetamine-dextroamphetamine (ADDERALL) 10 MG tablet Reorder  . amphetamine-dextroamphetamine (ADDERALL) 10 MG tablet Reorder  . amphetamine-dextroamphetamine (ADDERALL) 10 MG tablet Reorder  . amphetamine-dextroamphetamine (ADDERALL XR) 20 MG 24 hr capsule Reorder  . amphetamine-dextroamphetamine (ADDERALL XR) 20 MG 24 hr capsule Reorder  . amphetamine-dextroamphetamine (ADDERALL) 10 MG tablet Reorder    Follow-up: Return in about 6 months (around 10/26/2018) for ADD  .   Crecencio Mc, MD

## 2018-04-25 NOTE — Patient Instructions (Addendum)
   I am changing your BP med to Toprol XL 50 mg  Take it at night    refills on Adderall XR and Adderall  For aug, sept and october   Fasting labs prior to next visit in 6 month

## 2018-04-26 NOTE — Assessment & Plan Note (Signed)
Starting Toprol XL 50 mg daily,  Stopping inderal.

## 2018-04-26 NOTE — Assessment & Plan Note (Signed)
Patient has been given refills for Adderall XR 20 mg and Adderall 10  Mg for 3 months (August, September and October)   .   The risks and benefits of this medication have been reviewed with patient, including drug dependence,  increased risk of suicide , syncope, arrhythmia, sudden death, insomnia .

## 2018-05-10 MED FILL — DUPIXENT 300 MG/2 ML SAFE S: 300 | 28 days supply | Qty: 4 | Fill #5

## 2018-06-14 MED FILL — DUPIXENT 300 MG/2 ML SAFE S: 300 | 28 days supply | Qty: 4 | Fill #6

## 2018-07-11 MED FILL — DUPIXENT 300 MG/2 ML SAFE S: 300 | 28 days supply | Qty: 4 | Fill #7

## 2018-08-10 MED FILL — DUPIXENT 300 MG/2 ML SAFE S: 300 | 28 days supply | Qty: 4 | Fill #8

## 2018-08-30 ENCOUNTER — Other Ambulatory Visit: Payer: Self-pay | Admitting: Internal Medicine

## 2018-08-31 MED FILL — DUPIXENT 300 MG/2 ML SAFE S: 300 | 28 days supply | Qty: 4 | Fill #9

## 2018-10-06 MED FILL — DUPIXENT 300 MG/2 ML SAFE S: 300 | 28 days supply | Qty: 4 | Fill #10

## 2018-11-03 MED FILL — DUPIXENT 300 MG/2 ML SAFE S: 300 | 28 days supply | Qty: 4 | Fill #11

## 2018-11-07 DIAGNOSIS — L309 Dermatitis, unspecified: Secondary | ICD-10-CM

## 2018-12-05 ENCOUNTER — Other Ambulatory Visit: Payer: Self-pay | Admitting: Internal Medicine

## 2018-12-06 ENCOUNTER — Other Ambulatory Visit: Payer: Self-pay | Admitting: Pharmacist

## 2018-12-06 MED ORDER — DUPILUMAB 300 MG/2ML ~~LOC~~ SOSY: 300.0000 mg | PREFILLED_SYRINGE | SUBCUTANEOUS | 11 refills | Status: DC

## 2018-12-06 MED FILL — DUPIXENT 300 MG/2 ML SAFE S: 300 | 28 days supply | Qty: 4 | Fill #0

## 2018-12-13 ENCOUNTER — Other Ambulatory Visit: Payer: Self-pay | Admitting: Internal Medicine

## 2018-12-16 ENCOUNTER — Ambulatory Visit (INDEPENDENT_AMBULATORY_CARE_PROVIDER_SITE_OTHER): Payer: No Typology Code available for payment source | Admitting: Internal Medicine

## 2018-12-16 DIAGNOSIS — I10 Essential (primary) hypertension: Secondary | ICD-10-CM

## 2018-12-16 DIAGNOSIS — R748 Abnormal levels of other serum enzymes: Secondary | ICD-10-CM | POA: Diagnosis not present

## 2018-12-16 DIAGNOSIS — F411 Generalized anxiety disorder: Secondary | ICD-10-CM | POA: Diagnosis not present

## 2018-12-16 DIAGNOSIS — F900 Attention-deficit hyperactivity disorder, predominantly inattentive type: Secondary | ICD-10-CM | POA: Diagnosis not present

## 2018-12-16 DIAGNOSIS — E782 Mixed hyperlipidemia: Secondary | ICD-10-CM | POA: Diagnosis not present

## 2018-12-16 MED ORDER — AMPHETAMINE-DEXTROAMPHETAMINE 10 MG PO TABS: ORAL_TABLET | ORAL | 0 refills | Status: DC

## 2018-12-16 MED ORDER — AMPHETAMINE-DEXTROAMPHET ER 20 MG PO CP24: ORAL_CAPSULE | ORAL | 0 refills | Status: DC

## 2018-12-16 NOTE — Progress Notes (Signed)
Virtual Visit via Video Note  I connected with@ on 12/18/18 at  8:30 AM EDT by a video enabled telemedicine application and verified that I am speaking with the correct person using two identifiers.  Location patient: work [facility]  Persons participating in the virtual visit: patient, provider  I discussed the limitations of evaluation and management by telemedicine and the availability of in person appointments. The patient expressed understanding and agreed to proceed.   HPI:  33 yr old male with history of ADD,  Hypertension,  Hypertriglyceridemia   HTN:  Not taking toprol consistently bc he forgets  Elevated liver enzymes last April:  Serologies negative for autoimmune. No ultrasound to date.   discussed alcohol use,  Drinks more than 2 beers per night when  not on call. not exercising,    GAD:  Sleeping better ,  Using clonazepam prn   Allergic rhinitis: with cough during pollen season.  Has been using singulair and breathing better    ROS: See pertinent positives and negatives per HPI.  No past medical history on file.  Past Surgical History:  Procedure Laterality Date  . HERNIA REPAIR    . SMALL INTESTINE SURGERY      Family History  Problem Relation Age of Onset  . Arthritis Mother   . Heart disease Maternal Grandmother   . Heart disease Maternal Grandfather     SOCIAL HX: see above . Denies tobacco . single   Current Outpatient Medications:  .  ADDERALL XR 20 MG 24 hr capsule, TAKE 1 CAPSULE BY MOUTH ONCE DAILY, Disp: 30 capsule, Rfl: 0 .  amphetamine-dextroamphetamine (ADDERALL XR) 20 MG 24 hr capsule, TAKE 1 CAPSULE BY MOUTH ONCE DAILY, Disp: 30 capsule, Rfl: 0 .  [START ON 02/04/2019] amphetamine-dextroamphetamine (ADDERALL XR) 20 MG 24 hr capsule, TAKE 1 CAPSULE BY MOUTH ONCE DAILY, Disp: 30 capsule, Rfl: 0 .  [START ON 01/12/2019] amphetamine-dextroamphetamine (ADDERALL XR) 20 MG 24 hr capsule, TAKE 1 CAPSULE BY MOUTH ONCE DAILY, Disp: 30  capsule, Rfl: 0 .  [START ON 01/12/2019] amphetamine-dextroamphetamine (ADDERALL) 10 MG tablet, TAKE 1 TABLET BY MOUTH TWICE DAILY WITH MEALS., Disp: 60 tablet, Rfl: 0 .  [START ON 02/11/2019] amphetamine-dextroamphetamine (ADDERALL) 10 MG tablet, TAKE 1 TABLET BY MOUTH TWICE DAILY WITH MEALS, Disp: 60 tablet, Rfl: 0 .  clonazePAM (KLONOPIN) 0.25 MG disintegrating tablet, Take 1 tablet (0.25 mg total) by mouth daily as needed. Keep on file for future refills, Disp: 30 tablet, Rfl: 5 .  Dupilumab (DUPIXENT) 300 MG/2ML SOSY, Inject 300 mg into the skin every 14 (fourteen) days. (every 2 weeks) starting on day 15 as directed, Disp: 4 mL, Rfl: 11 .  ergocalciferol (DRISDOL) 50000 units capsule, Take 1 capsule (50,000 Units total) by mouth once a week., Disp: 12 capsule, Rfl: 0 .  fexofenadine (ALLEGRA) 180 MG tablet, Take 1 tablet (180 mg total) by mouth daily., Disp: 30 tablet, Rfl: 1 .  hydrOXYzine (ATARAX/VISTARIL) 50 MG tablet, Take 1 tablet (50 mg total) by mouth 3 (three) times daily as needed., Disp: 30 tablet, Rfl: 0 .  metoprolol succinate (TOPROL-XL) 50 MG 24 hr tablet, Take 1 tablet (50 mg total) by mouth daily. Take with or immediately following a meal., Disp: 90 tablet, Rfl: 3 .  montelukast (SINGULAIR) 10 MG tablet, Take 1 tablet (10 mg total) by mouth at bedtime., Disp: 30 tablet, Rfl: 3 .  propranolol (INDERAL) 10 MG tablet, TAKE 1/2 TABLET BY MOUTH 3 TIMES DAILY AS NEEDED, Disp: 90 tablet,  Rfl: 5 .  triamcinolone ointment (KENALOG) 0.1 %, , Disp: , Rfl: 0  EXAM:  VITALS per patient if applicable:  GENERAL: alert, oriented, appears well and in no acute distress  HEENT: atraumatic, conjunttiva clear, no obvious abnormalities on inspection of external nose and ears  NECK: normal movements of the head and neck  LUNGS: on inspection no signs of respiratory distress, breathing rate appears normal, no obvious gross SOB, gasping or wheezing  CV: no obvious cyanosis  MS: moves all  visible extremities without noticeable abnormality  PSYCH/NEURO: pleasant and cooperative, no obvious depression or anxiety, speech and thought processing grossly intact  ASSESSMENT AND PLAN:  Discussed the following assessment and plan:  Elevated triglycerides with high cholesterol - Plan: Lipid panel  Attention deficit hyperactivity disorder (ADHD), predominantly inattentive type  Elevated liver enzymes - Plan: Comprehensive metabolic panel  Generalized anxiety disorder  Essential hypertension   I discussed the assessment and treatment plan with the patient. The patient was provided an opportunity to ask questions and all were answered. The patient agreed with the plan and demonstrated an understanding of the instructions.   The patient was advised to call back or seek an in-person evaluation if the symptoms worsen or if the condition fails to improve as anticipated.  I provided 25 minutes of non-face-to-face time during this encounter.   Crecencio Mc, MD

## 2018-12-18 MED ORDER — AMPHETAMINE-DEXTROAMPHET ER 20 MG PO CP24: ORAL_CAPSULE | ORAL | 0 refills | Status: DC

## 2018-12-18 MED ORDER — AMPHETAMINE-DEXTROAMPHETAMINE 10 MG PO TABS: ORAL_TABLET | ORAL | 0 refills | Status: DC

## 2018-12-18 MED ORDER — CLONAZEPAM 0.25 MG PO TBDP: 0.2500 mg | ORAL_TABLET | Freq: Every day | ORAL | 5 refills | Status: DC | PRN

## 2018-12-18 NOTE — Assessment & Plan Note (Signed)
Patient has been given refills for Adderall XR 20 mg and Adderall 10  Mg for 3 months (March , April and May 2020   .   The risks and benefits of this medication have been reviewed with patient, including drug dependence,  increased risk of suicide , syncope, arrhythmia, sudden death, insomnia .

## 2018-12-18 NOTE — Assessment & Plan Note (Signed)
Pattern suggests fatty liver given normal serologic workup . Repeat due,  Will need ultrasound if persistently elevated.  Periodic overuse of Alcohol identified and addressed

## 2018-12-18 NOTE — Assessment & Plan Note (Addendum)
Improved with repeat fasting panel  Encouraged to exercise and follow a low GI diet. Repeat panel is due and ordered   Lab Results  Component Value Date   CHOL 201 (H) 11/03/2017   HDL 41.20 11/03/2017   LDLCALC 124 (H) 11/03/2017   LDLDIRECT 117.0 03/16/2016   TRIG 180.0 (H) 11/03/2017   CHOLHDL 5 11/03/2017

## 2018-12-18 NOTE — Assessment & Plan Note (Addendum)
Medication adherence an issue per patient  Advised to take Toprol at night with clonazepam

## 2018-12-18 NOTE — Assessment & Plan Note (Signed)
Well controlled on current regimen. , no changes today. Clonazepam for use prn and refilled .  The risks and benefits of benzodiazepine use were reviewed with patient today including excessive sedation leading to respiratory depression,  impaired thinking/driving,  Addiction and dementia   Patient was advised to avoid concurrent use with alcohol, to use medication only as needed and not to share with others  .  ? ?

## 2018-12-27 MED FILL — DUPIXENT 300 MG/2 ML SAFE S: 300 | 28 days supply | Qty: 4 | Fill #1

## 2019-01-02 ENCOUNTER — Encounter: Payer: Self-pay | Admitting: Pharmacist

## 2019-01-02 ENCOUNTER — Ambulatory Visit (INDEPENDENT_AMBULATORY_CARE_PROVIDER_SITE_OTHER): Payer: No Typology Code available for payment source | Admitting: Pharmacist

## 2019-01-02 DIAGNOSIS — Z79899 Other long term (current) drug therapy: Secondary | ICD-10-CM

## 2019-01-02 NOTE — Progress Notes (Signed)
   S: Patient presents for review of their specialty medication therapy.  Patient is currently taking Dupixent for atopic dermatitis . Patient is managed by [facility] for this.   Adherence: denies any missed doses  Efficacy: reports that it is working well and denies any adverse effects  Dosing: 300 mg every 2 weeks  Monitoring: S/sx of infection: denies Hypersensitivity reaction: denies Ocular effects: denies S/sx of eosinophilia/vasculitis: denies  O:     Lab Results  Component Value Date   WBC 8.6 11/03/2017   HGB 14.7 11/03/2017   HCT 42.9 11/03/2017   MCV 92.0 11/03/2017   PLT 322.0 11/03/2017      Chemistry      Component Value Date/Time   NA 138 11/03/2017 0953   NA 138 04/27/2014 0741   NA 138 04/27/2014 0741   K 4.3 11/03/2017 0953   K 4.1 04/27/2014 0741   CL 104 11/03/2017 0953   CL 104 04/27/2014 0741   CO2 27 11/03/2017 0953   CO2 26 04/27/2014 0741   BUN 14 11/03/2017 0953   BUN 19 04/27/2014 0741   BUN 19 (H) 04/27/2014 0741   CREATININE 0.92 11/03/2017 0953   CREATININE 1.12 04/27/2014 0741   GLU 102 04/27/2014 0741      Component Value Date/Time   CALCIUM 9.4 11/03/2017 0953   CALCIUM 8.7 04/27/2014 0741   ALKPHOS 63 11/03/2017 0953   ALKPHOS 78 04/27/2014 0741   AST 33 12/27/2017 1403   AST 25 04/27/2014 0741   ALT 46 12/27/2017 1403   ALT 40 04/27/2014 0741   BILITOT 0.6 12/27/2017 1403   BILITOT 0.6 04/27/2014 0741       A/P: 1. Medication review: Patient currently on [place] for atopic dermatitis and is tolerating it well with no adverse effects and good control of his atopic dermatitis. Reviewed medication with patient, including the following: Dupixent is a monoclonal antibody used for atopic dermatitis and asthma. Possible adverse effects include increased risk of infection, ocular effects, injection site reactions, and eosinophilia/vasculitis. No recommendations for any changes.   Christella Hartigan, PharmD, BCPS, BCACP,  CPP Clinical Pharmacist Practitioner  [phone]

## 2019-01-04 ENCOUNTER — Ambulatory Visit: Payer: Self-pay | Admitting: Internal Medicine

## 2019-01-25 MED FILL — DUPIXENT 300 MG/2 ML SAFE S: 300 | 28 days supply | Qty: 4 | Fill #2

## 2019-01-25 MED FILL — METOPROLOL SUCCINATE ER 50: 50 | 90 days supply | Qty: 90 | Fill #0

## 2019-02-17 ENCOUNTER — Other Ambulatory Visit: Payer: Self-pay

## 2019-02-21 ENCOUNTER — Ambulatory Visit: Payer: Self-pay | Admitting: Internal Medicine

## 2019-02-24 MED FILL — DUPIXENT 300 MG/2 ML SAFE S: 300 | 28 days supply | Qty: 4 | Fill #3

## 2019-03-16 ENCOUNTER — Other Ambulatory Visit: Payer: Self-pay

## 2019-03-16 ENCOUNTER — Other Ambulatory Visit (INDEPENDENT_AMBULATORY_CARE_PROVIDER_SITE_OTHER): Payer: No Typology Code available for payment source

## 2019-03-16 DIAGNOSIS — E782 Mixed hyperlipidemia: Secondary | ICD-10-CM | POA: Diagnosis not present

## 2019-03-16 DIAGNOSIS — R748 Abnormal levels of other serum enzymes: Secondary | ICD-10-CM | POA: Diagnosis not present

## 2019-03-16 LAB — COMPREHENSIVE METABOLIC PANEL
ALT: 46 U/L (ref 0–53)
AST: 29 U/L (ref 0–37)
Albumin: 4.2 g/dL (ref 3.5–5.2)
Alkaline Phosphatase: 70 U/L (ref 39–117)
BUN: 15 mg/dL (ref 6–23)
CO2: 27 mEq/L (ref 19–32)
Calcium: 8.8 mg/dL (ref 8.4–10.5)
Chloride: 106 mEq/L (ref 96–112)
Creatinine, Ser: 0.89 mg/dL (ref 0.40–1.50)
GFR: 97.4 mL/min (ref >60.00)
Glucose, Bld: 93 mg/dL (ref 70–99)
Potassium: 4.5 mEq/L (ref 3.5–5.1)
Sodium: 140 mEq/L (ref 135–145)
Total Bilirubin: 0.4 mg/dL (ref 0.2–1.2)
Total Protein: 6.8 g/dL (ref 6.0–8.3)

## 2019-03-16 LAB — LIPID PANEL
Cholesterol: 195 mg/dL (ref 0–200)
HDL: 40.1 mg/dL (ref >39.00)
LDL Cholesterol: 135 mg/dL — ABNORMAL HIGH (ref 0–99)
NonHDL: 154.48
Total CHOL/HDL Ratio: 5
Triglycerides: 96 mg/dL (ref 0.0–149.0)
VLDL: 19.2 mg/dL (ref 0.0–40.0)

## 2019-03-21 ENCOUNTER — Other Ambulatory Visit: Payer: Self-pay

## 2019-03-29 ENCOUNTER — Ambulatory Visit (INDEPENDENT_AMBULATORY_CARE_PROVIDER_SITE_OTHER): Payer: No Typology Code available for payment source | Admitting: Internal Medicine

## 2019-03-29 ENCOUNTER — Other Ambulatory Visit: Payer: Self-pay

## 2019-03-29 ENCOUNTER — Encounter: Payer: Self-pay | Admitting: Internal Medicine

## 2019-03-29 VITALS — BP 122/74 | HR 82 | Temp 98.2°F | Resp 15 | Ht 66.0 in | Wt 209.0 lb

## 2019-03-29 DIAGNOSIS — R748 Abnormal levels of other serum enzymes: Secondary | ICD-10-CM

## 2019-03-29 DIAGNOSIS — Z0000 Encounter for general adult medical examination without abnormal findings: Secondary | ICD-10-CM | POA: Diagnosis not present

## 2019-03-29 DIAGNOSIS — H16001 Unspecified corneal ulcer, right eye: Secondary | ICD-10-CM

## 2019-03-29 DIAGNOSIS — L309 Dermatitis, unspecified: Secondary | ICD-10-CM

## 2019-03-29 DIAGNOSIS — F900 Attention-deficit hyperactivity disorder, predominantly inattentive type: Secondary | ICD-10-CM | POA: Diagnosis not present

## 2019-03-29 DIAGNOSIS — E559 Vitamin D deficiency, unspecified: Secondary | ICD-10-CM

## 2019-03-29 DIAGNOSIS — E669 Obesity, unspecified: Secondary | ICD-10-CM

## 2019-03-29 DIAGNOSIS — I10 Essential (primary) hypertension: Secondary | ICD-10-CM

## 2019-03-29 DIAGNOSIS — E782 Mixed hyperlipidemia: Secondary | ICD-10-CM

## 2019-03-29 MED ORDER — METOPROLOL SUCCINATE ER 50 MG PO TB24: 50.0000 mg | ORAL_TABLET | Freq: Every day | ORAL | 3 refills | Status: DC

## 2019-03-29 MED ORDER — AMPHETAMINE-DEXTROAMPHET ER 15 MG PO CP24: 15.0000 mg | ORAL_CAPSULE | ORAL | 0 refills | Status: DC

## 2019-03-29 MED FILL — DUPIXENT 300 MG/2 ML SAFE S: 300 | 28 days supply | Qty: 4 | Fill #4

## 2019-03-29 NOTE — Patient Instructions (Signed)
I have reduced your Adderall XR to 15 mg dose as  A TRIAL,  (per your request)  For 7 days    Health Maintenance, Male Adopting a healthy lifestyle and getting preventive care are important in promoting health and wellness. Ask your health care provider about:  The right schedule for you to have regular tests and exams.  Things you can do on your own to prevent diseases and keep yourself healthy. What should I know about diet, weight, and exercise? Eat a healthy diet   Eat a diet that includes plenty of vegetables, fruits, low-fat dairy products, and lean protein.  Do not eat a lot of foods that are high in solid fats, added sugars, or sodium. Maintain a healthy weight Body mass index (BMI) is a measurement that can be used to identify possible weight problems. It estimates body fat based on height and weight. Your health care provider can help determine your BMI and help you achieve or maintain a healthy weight. Get regular exercise Get regular exercise. This is one of the most important things you can do for your health. Most adults should:  Exercise for at least 150 minutes each week. The exercise should increase your heart rate and make you sweat (moderate-intensity exercise).  Do strengthening exercises at least twice a week. This is in addition to the moderate-intensity exercise.  Spend less time sitting. Even light physical activity can be beneficial. Watch cholesterol and blood lipids Have your blood tested for lipids and cholesterol at 35 years of age, then have this test every 5 years. You may need to have your cholesterol levels checked more often if:  Your lipid or cholesterol levels are high.  You are older than 35 years of age.  You are at high risk for heart disease. What should I know about cancer screening? Many types of cancers can be detected early and may often be prevented. Depending on your health history and family history, you may need to have cancer  screening at various ages. This may include screening for:  Colorectal cancer.  Prostate cancer.  Skin cancer.  Lung cancer. What should I know about heart disease, diabetes, and high blood pressure? Blood pressure and heart disease  High blood pressure causes heart disease and increases the risk of stroke. This is more likely to develop in people who have high blood pressure readings, are of African descent, or are overweight.  Talk with your health care provider about your target blood pressure readings.  Have your blood pressure checked: ? Every 3-5 years if you are 48-36 years of age. ? Every year if you are 37 years old or older.  If you are between the ages of 94 and 34 and are a current or former smoker, ask your health care provider if you should have a one-time screening for abdominal aortic aneurysm (AAA). Diabetes Have regular diabetes screenings. This checks your fasting blood sugar level. Have the screening done:  Once every three years after age 4 if you are at a normal weight and have a low risk for diabetes.  More often and at a younger age if you are overweight or have a high risk for diabetes. What should I know about preventing infection? Hepatitis B If you have a higher risk for hepatitis B, you should be screened for this virus. Talk with your health care provider to find out if you are at risk for hepatitis B infection. Hepatitis C Blood testing is recommended for:  Everyone  born from [birth year] through 1967.  Anyone with known risk factors for hepatitis C. Sexually transmitted infections (STIs)  You should be screened each year for STIs, including gonorrhea and chlamydia, if: ? You are sexually active and are younger than 35 years of age. ? You are older than 36 years of age and your health care provider tells you that you are at risk for this type of infection. ? Your sexual activity has changed since you were last screened, and you are at increased risk  for chlamydia or gonorrhea. Ask your health care provider if you are at risk.  Ask your health care provider about whether you are at high risk for HIV. Your health care provider may recommend a prescription medicine to help prevent HIV infection. If you choose to take medicine to prevent HIV, you should first get tested for HIV. You should then be tested every 3 months for as long as you are taking the medicine. Follow these instructions at home: Lifestyle  Do not use any products that contain nicotine or tobacco, such as cigarettes, e-cigarettes, and chewing tobacco. If you need help quitting, ask your health care provider.  Do not use street drugs.  Do not share needles.  Ask your health care provider for help if you need support or information about quitting drugs. Alcohol use  Do not drink alcohol if your health care provider tells you not to drink.  If you drink alcohol: ? Limit how much you have to 0-2 drinks a day. ? Be aware of how much alcohol is in your drink. In the [place] one drink equals one 12 oz bottle of beer (355 mL), one 5 oz glass of wine (148 mL), or one 1 oz glass of hard liquor (44 mL). General instructions  Schedule regular health, dental, and eye exams.  Stay current with your vaccines.  Tell your health care provider if: ? You often feel depressed. ? You have ever been abused or do not feel safe at home. Summary  Adopting a healthy lifestyle and getting preventive care are important in promoting health and wellness.  Follow your health care provider's instructions about healthy diet, exercising, and getting tested or screened for diseases.  Follow your health care provider's instructions on monitoring your cholesterol and blood pressure. This information is not intended to replace advice given to you by your health care provider. Make sure you discuss any questions you have with your health care provider. Document Released: 03/05/2008 Document Revised:  08/31/2018 Document Reviewed: 08/31/2018 Elsevier Patient Education  2020 [employer].

## 2019-03-29 NOTE — Progress Notes (Signed)
Patient ID: Christopher Terrell, male    DOB: 04-Feb-1986  Age: 33 y.o. MRN: [id number]  The patient is here for annual preventive  examination and management of other chronic and acute problems.   The risk factors are reflected in the social history.  The roster of all physicians providing medical care to patient - is listed in the Snapshot section of the chart.  Activities of daily living:  The patient is 100% independent in all ADLs: dressing, toileting, feeding as well as independent mobility  Home safety : The patient has smoke detectors in the home. They wear seatbelts.  There are no firearms at home. There is no violence in the home.   There is no risks for hepatitis, STDs or HIV. There is no   history of blood transfusion. They have no travel history to infectious disease endemic areas of the world.  The patient has seen their dentist in the last six month. They have seen their eye doctor in the last year for annual renewal of corrective lenses/contact lenses . Christopher Saunas do not  have excessive sun exposure. Discussed the need for sun protection: hats, long sleeves and use of sunscreen if there is significant sun exposure.   Diet: the importance of a healthy diet is discussed. They do have a healthy diet.  Depression screen: there are no signs or vegative symptoms of depression- irritability, change in appetite, anhedonia, sadness/tearfullness.   The following portions of the patient's history were reviewed and updated as appropriate: allergies, current medications, past family history, past medical history,  past surgical history, past social history  and problem list.  Visual acuity was not assessed per patient preference since she has regular follow up with his ophthalmologist. Hearing and body mass index were assessed and reviewed.   During the course of the visit the patient was educated and counseled about appropriate screening and preventive services including : fall prevention ,  diabetes screening, nutrition counseling, colorectal cancer screening, and recommended immunizations.    CC: Diagnoses of Obesity (BMI 30.0-34.9), Corneal ulcer (including herpetic), right, Attention deficit hyperactivity disorder (ADHD), predominantly inattentive type, Eczema, unspecified type, Essential hypertension, Vitamin D deficiency, Elevated liver enzymes, Elevated triglycerides with high cholesterol, and Encounter for preventive health examination were pertinent to this visit.  Right corneal ulcer.  Now on steroid /abx drops (tobramycin and dexamethsone) attributed to trauma iatrogenic from removing a contact lense.    ECZEMA  :  seeing derm for management  Of condition that had become steroid dependent   Now using Dupixent inectible for the past 1.5 years ,  Has been able to lengthen interval from every 14 to to  every 18  Days with no relapse .  History Christopher Terrell has no past medical history on file.   He has a past surgical history that includes Small intestine surgery and Hernia repair.   His family history includes Arthritis in his mother; Heart disease in his maternal grandfather and maternal grandmother; Lung cancer in his mother.He reports that he quit smoking about 7 years ago. He has never used smokeless tobacco. He reports current alcohol use of about 7.0 standard drinks of alcohol per week. He reports that he does not use drugs.  Outpatient Medications Prior to Visit  Medication Sig Dispense Refill  . ADDERALL XR 20 MG 24 hr capsule TAKE 1 CAPSULE BY MOUTH ONCE DAILY 30 capsule 0  . amphetamine-dextroamphetamine (ADDERALL XR) 20 MG 24 hr capsule TAKE 1 CAPSULE BY MOUTH ONCE DAILY 30 capsule  0  . amphetamine-dextroamphetamine (ADDERALL XR) 20 MG 24 hr capsule TAKE 1 CAPSULE BY MOUTH ONCE DAILY 30 capsule 0  . amphetamine-dextroamphetamine (ADDERALL XR) 20 MG 24 hr capsule TAKE 1 CAPSULE BY MOUTH ONCE DAILY 30 capsule 0  . amphetamine-dextroamphetamine (ADDERALL) 10 MG tablet TAKE  1 TABLET BY MOUTH TWICE DAILY WITH MEALS. 60 tablet 0  . amphetamine-dextroamphetamine (ADDERALL) 10 MG tablet TAKE 1 TABLET BY MOUTH TWICE DAILY WITH MEALS 60 tablet 0  . clonazePAM (KLONOPIN) 0.25 MG disintegrating tablet Take 1 tablet (0.25 mg total) by mouth daily as needed. Keep on file for future refills 30 tablet 5  . Dupilumab (DUPIXENT) 300 MG/2ML SOSY Inject 300 mg into the skin every 14 (fourteen) days. (every 2 weeks) starting on day 15 as directed 4 mL 11  . montelukast (SINGULAIR) 10 MG tablet Take 1 tablet (10 mg total) by mouth at bedtime. 30 tablet 3  . triamcinolone ointment (KENALOG) 0.1 %   0  . metoprolol succinate (TOPROL-XL) 50 MG 24 hr tablet Take 1 tablet (50 mg total) by mouth daily. Take with or immediately following a meal. 90 tablet 3  . hydrOXYzine (ATARAX/VISTARIL) 50 MG tablet Take 1 tablet (50 mg total) by mouth 3 (three) times daily as needed. (Patient not taking: Reported on 03/29/2019) 30 tablet 0  . propranolol (INDERAL) 10 MG tablet TAKE 1/2 TABLET BY MOUTH 3 TIMES DAILY AS NEEDED (Patient not taking: Reported on 03/29/2019) 90 tablet 5  . ergocalciferol (DRISDOL) 50000 units capsule Take 1 capsule (50,000 Units total) by mouth once a week. (Patient not taking: Reported on 03/29/2019) 12 capsule 0  . fexofenadine (ALLEGRA) 180 MG tablet Take 1 tablet (180 mg total) by mouth daily. (Patient not taking: Reported on 03/29/2019) 30 tablet 1   No facility-administered medications prior to visit.     Review of Systems   Patient denies headache, fevers, malaise, unintentional weight loss, skin rash, eye pain, sinus congestion and sinus pain, sore throat, dysphagia,  hemoptysis , cough, dyspnea, wheezing, chest pain, palpitations, orthopnea, edema, abdominal pain, nausea, melena, diarrhea, constipation, flank pain, dysuria, hematuria, urinary  Frequency, nocturia, numbness, tingling, seizures,  Focal weakness, Loss of consciousness,  Tremor, insomnia, depression, anxiety, and  suicidal ideation.      Objective:  BP 122/74 (BP Location: Left Arm, Patient Position: Sitting, Cuff Size: Large)   Pulse 82   Temp 98.2 F (36.8 C) (Oral)   Resp 15   Ht 5\' 6"  (1.676 m)   Wt 209 lb (94.8 kg)   SpO2 98%   BMI 33.73 kg/m   Physical Exam   General appearance: alert, cooperative and appears stated age Ears: right scleral erythema,  No purulent conjunctivitis.  lef eye normal.. normal TM's and external ear canals both ears Throat: lips, mucosa, and tongue normal; teeth and gums normal Neck: no adenopathy, no carotid bruit, supple, symmetrical, trachea midline and thyroid not enlarged, symmetric, no tenderness/mass/nodules Back: symmetric, no curvature. ROM normal. No CVA tenderness. Lungs: clear to auscultation bilaterally Heart: regular rate and rhythm, S1, S2 normal, no murmur, click, rub or gallop Abdomen: soft, non-tender; bowel sounds normal; no masses,  no organomegaly Pulses: 2+ and symmetric Skin: Skin color, texture, turgor normal. No rashes or lesions Lymph nodes: Cervical, supraclavicular, and axillary nodes normal.   Assessment & Plan:   Problem List Items Addressed This Visit      Unprioritized   Vitamin D deficiency    Noted last year  . Reminded to supplement with  1000 IUs daily       Obesity (BMI 30.0-34.9)    Personal best was Oct 2016  184 lbs (BMI was < 30).  He is actively participating in regular exercise.  Reviewed diet, goals.         Hypertension    Improved control nighttime administration of Toprol . No changes today      Relevant Medications   metoprolol succinate (TOPROL-XL) 50 MG 24 hr tablet   Encounter for preventive health examination    age appropriate education and counseling updated, referrals for preventative services and immunizations addressed, dietary and smoking counseling addressed, most recent labs reviewed.  I have personally reviewed and have noted:  1) the patient's medical and social history 2) The  pt's use of alcohol, tobacco, and illicit drugs 3) The patient's current medications and supplements 4) Functional ability including ADL's, fall risk, home safety risk, hearing and visual impairment 5) Diet and physical activities 6) Evidence for depression or mood disorder 7) The patient's height, weight, and BMI have been recorded in the chart  I have made referrals, and provided counseling and education based on review of the above      Elevated triglycerides with high cholesterol    Improved with repeat fasting panel after adhering to low GI diet and exercising regularly   Lab Results  Component Value Date   CHOL 195 03/16/2019   HDL 40.10 03/16/2019   LDLCALC 135 (H) 03/16/2019   LDLDIRECT 117.0 03/16/2016   TRIG 96.0 03/16/2019   CHOLHDL 5 03/16/2019         Relevant Medications   metoprolol succinate (TOPROL-XL) 50 MG 24 hr tablet   Elevated liver enzymes    Pattern suggestedfatty liver given normal serologic workup .Periodic overuse of Alcohol identified and addressed; repeat enzymes are normal   Lab Results  Component Value Date   ALT 46 03/16/2019   AST 29 03/16/2019   ALKPHOS 70 03/16/2019   BILITOT 0.4 03/16/2019         Eczema    Managed by Dermatology with injectible immunotherapeutic agent  Dupilumab NOW INJECTED every  18 days. Continue  Allegra,  singulair stopped due to side effect of sedation .       Corneal ulcer (including herpetic), right    Under treatment with tobradex for traumatic ulcer due to contact lense      Attention deficit hyperactivity disorder (ADHD), predominantly inattentive type    Patient has requested a reduction in dose of his XR dose  And has been prescribed Adderall XR 15 mg for one WEEK,  He will continue using Adderall 10  Mg for the second dose  .   The risks and benefits of this medication have been reviewed with patient, including drug dependence,  increased risk of suicide , syncope, arrhythmia, sudden death, insomnia  .         I have discontinued [place]. Thurlow's ergocalciferol and fexofenadine. I am also having him start on amphetamine-dextroamphetamine and tobramycin-dexamethasone. Additionally, I am having him maintain his hydrOXYzine, triamcinolone ointment, montelukast, propranolol, dupilumab, Adderall XR, amphetamine-dextroamphetamine, amphetamine-dextroamphetamine, amphetamine-dextroamphetamine, amphetamine-dextroamphetamine, amphetamine-dextroamphetamine, clonazePAM, and metoprolol succinate.  Meds ordered this encounter  Medications  . amphetamine-dextroamphetamine (ADDERALL XR) 15 MG 24 hr capsule    Sig: Take 1 capsule by mouth every morning.    Dispense:  7 capsule    Refill:  0    7 DAY TRIAL OF LOWER DOSE  . DISCONTD: metoprolol succinate (TOPROL-XL) 50 MG 24 hr tablet  Sig: Take 1 tablet (50 mg total) by mouth daily. Take with or immediately following a meal.    Dispense:  90 tablet    Refill:  3  . metoprolol succinate (TOPROL-XL) 50 MG 24 hr tablet    Sig: Take 1 tablet (50 mg total) by mouth daily. Take with or immediately following a meal.    Dispense:  90 tablet    Refill:  3  . tobramycin-dexamethasone (TOBRADEX) ophthalmic solution    Sig: Place 2 drops into the right eye every 4 (four) hours while awake.    Dispense:  5 mL    Refill:  0    Medications Discontinued During This Encounter  Medication Reason  . ergocalciferol (DRISDOL) 50000 units capsule Completed Course  . fexofenadine (ALLEGRA) 180 MG tablet Patient Preference  . metoprolol succinate (TOPROL-XL) 50 MG 24 hr tablet Reorder  . metoprolol succinate (TOPROL-XL) 50 MG 24 hr tablet Reorder    Follow-up: No follow-ups on file.   Crecencio Mc, MD

## 2019-03-30 ENCOUNTER — Encounter: Payer: Self-pay | Admitting: Internal Medicine

## 2019-03-30 DIAGNOSIS — H16001 Unspecified corneal ulcer, right eye: Secondary | ICD-10-CM | POA: Insufficient documentation

## 2019-03-30 MED ORDER — TOBRAMYCIN-DEXAMETHASONE 0.3-0.1 % OP SUSP: 2.0000 [drp] | OPHTHALMIC | 0 refills | Status: DC

## 2019-03-30 NOTE — Assessment & Plan Note (Signed)
Managed by Dermatology with injectible immunotherapeutic agent  Dupilumab NOW INJECTED every  18 days. Continue  Allegra,  singulair stopped due to side effect of sedation .

## 2019-03-30 NOTE — Assessment & Plan Note (Signed)
Improved with repeat fasting panel after adhering to low GI diet and exercising regularly   Lab Results  Component Value Date   CHOL 195 03/16/2019   HDL 40.10 03/16/2019   LDLCALC 135 (H) 03/16/2019   LDLDIRECT 117.0 03/16/2016   TRIG 96.0 03/16/2019   CHOLHDL 5 03/16/2019

## 2019-03-30 NOTE — Assessment & Plan Note (Addendum)
Pattern suggestedfatty liver given normal serologic workup .Periodic overuse of Alcohol identified and addressed; repeat enzymes are normal   Lab Results  Component Value Date   ALT 46 03/16/2019   AST 29 03/16/2019   ALKPHOS 70 03/16/2019   BILITOT 0.4 03/16/2019

## 2019-03-30 NOTE — Assessment & Plan Note (Signed)

## 2019-03-30 NOTE — Assessment & Plan Note (Signed)
Under treatment with tobradex for traumatic ulcer due to contact lense

## 2019-03-30 NOTE — Assessment & Plan Note (Signed)
Noted last year  . Reminded to supplement with 1000 IUs daily

## 2019-03-30 NOTE — Assessment & Plan Note (Signed)
Personal best was Oct 2016  184 lbs (BMI was < 30).  He is actively participating in regular exercise.  Reviewed diet, goals.

## 2019-03-30 NOTE — Assessment & Plan Note (Signed)
Improved control nighttime administration of Toprol . No changes today

## 2019-03-30 NOTE — Assessment & Plan Note (Addendum)
Patient has requested a reduction in dose of his XR dose  And has been prescribed Adderall XR 15 mg for one WEEK,  He will continue using Adderall 10  Mg for the second dose  .   The risks and benefits of this medication have been reviewed with patient, including drug dependence,  increased risk of suicide , syncope, arrhythmia, sudden death, insomnia .

## 2019-04-25 MED FILL — DUPIXENT 300 MG/2 ML SAFE S: 300 | 28 days supply | Qty: 4 | Fill #5

## 2019-04-27 ENCOUNTER — Other Ambulatory Visit: Payer: Self-pay | Admitting: Internal Medicine

## 2019-05-01 ENCOUNTER — Other Ambulatory Visit: Payer: Self-pay | Admitting: Internal Medicine

## 2019-05-01 ENCOUNTER — Encounter: Payer: Self-pay | Admitting: Physician Assistant

## 2019-05-01 ENCOUNTER — Telehealth: Payer: No Typology Code available for payment source | Admitting: Physician Assistant

## 2019-05-01 DIAGNOSIS — M549 Dorsalgia, unspecified: Secondary | ICD-10-CM

## 2019-05-01 DIAGNOSIS — M545 Low back pain, unspecified: Secondary | ICD-10-CM

## 2019-05-01 MED ORDER — CYCLOBENZAPRINE HCL 10 MG PO TABS: 10.0000 mg | ORAL_TABLET | Freq: Three times a day (TID) | ORAL | 0 refills | Status: DC | PRN

## 2019-05-01 MED ORDER — NAPROXEN 500 MG PO TABS: 500.0000 mg | ORAL_TABLET | Freq: Two times a day (BID) | ORAL | 0 refills | Status: DC

## 2019-05-01 NOTE — Progress Notes (Signed)

## 2019-05-01 NOTE — Progress Notes (Unsigned)
amb ref to  

## 2019-05-25 MED FILL — DUPIXENT 300 MG/2 ML SAFE S: 300 | 28 days supply | Qty: 4 | Fill #6

## 2019-07-04 ENCOUNTER — Other Ambulatory Visit: Payer: Self-pay | Admitting: Internal Medicine

## 2019-07-04 MED FILL — DUPIXENT 300 MG/2 ML SAFE S: 300 | 28 days supply | Qty: 4 | Fill #7

## 2019-07-31 MED FILL — DUPIXENT 300 MG/2 ML SAFE S: 300 | 28 days supply | Qty: 4 | Fill #8

## 2019-08-31 MED FILL — DUPIXENT 300 MG/2 ML SAFE S: 300 | 28 days supply | Qty: 4 | Fill #9

## 2019-09-28 MED FILL — DUPIXENT 300 MG/2 ML SAFE S: 300 | 28 days supply | Qty: 4 | Fill #10

## 2019-10-03 ENCOUNTER — Encounter: Payer: Self-pay | Admitting: Internal Medicine

## 2019-10-03 ENCOUNTER — Other Ambulatory Visit: Payer: Self-pay | Admitting: Internal Medicine

## 2019-10-03 ENCOUNTER — Ambulatory Visit (INDEPENDENT_AMBULATORY_CARE_PROVIDER_SITE_OTHER): Payer: No Typology Code available for payment source | Admitting: Internal Medicine

## 2019-10-03 ENCOUNTER — Telehealth: Payer: Self-pay | Admitting: Internal Medicine

## 2019-10-03 DIAGNOSIS — L309 Dermatitis, unspecified: Secondary | ICD-10-CM | POA: Diagnosis not present

## 2019-10-03 DIAGNOSIS — E669 Obesity, unspecified: Secondary | ICD-10-CM

## 2019-10-03 DIAGNOSIS — F411 Generalized anxiety disorder: Secondary | ICD-10-CM | POA: Diagnosis not present

## 2019-10-03 MED ORDER — PREDNISONE 10 MG PO TABS: ORAL_TABLET | ORAL | 0 refills | Status: DC

## 2019-10-03 NOTE — Progress Notes (Signed)
red 

## 2019-10-03 NOTE — Assessment & Plan Note (Signed)
His weight has been "up ad down: for the past several months aggravated by pandemic and holidays.  encoraged to start an exercise program

## 2019-10-03 NOTE — Progress Notes (Signed)
Virtual Visit via [email or web address]  This visit type was conducted due to national recommendations for restrictions regarding the COVID-19 pandemic (e.g. social distancing).  This format is felt to be most appropriate for this patient at this time.  All issues noted in this document were discussed and addressed.  No physical exam was performed (except for noted visual exam findings with Video Visits).   I connected with@ on 10/03/19 at  1:00 PM EST by a video enabled telemedicine application and verified that I am speaking with the correct person using two identifiers. Location patient: home Location provider: work or home office Persons participating in the virtual visit: patient, provider  I discussed the limitations, risks, security and privacy concerns of performing an evaluation and management service by telephone and the availability of in person appointments. I also discussed with the patient that there may be a patient responsible charge related to this service. The patient expressed understanding and agreed to proceed.  Reason for visit: follow up ,  Med refill  HPI:  34 yr old male with ADD , GAD and eczema presents for follow up on medications.  Currently taking the requested reduced dose of 15 mg adderall XR in the am and 10 mg in the afternoon  As a trial.  Thus far he is tolerating the change without side effects and his vital signs are normal .  Recent flare of back pain occurred after unusual outdoor work,  Resolved with several doses of flexeril..  Discussed keeping  a prednisone taper on file at pharmacy  Obesity:  Adits to eating poorly for the last several weeks,   No regular exercise.  Diet and schedule reviewed.      ROS: See pertinent positives and negatives per HPI.  No past medical history on file.  Past Surgical History:  Procedure Laterality Date  . HERNIA REPAIR    . SMALL INTESTINE SURGERY      Family History  Problem Relation Age of Onset  . Arthritis Mother    . Lung cancer Mother   . Heart disease Maternal Grandmother   . Heart disease Maternal Grandfather     SOCIAL HX:  reports that he quit smoking about 8 years ago. He has never used smokeless tobacco. He reports current alcohol use of about 7.0 standard drinks of alcohol per week. He reports that he does not use drugs.   Current Outpatient Medications:  .  ADDERALL XR 20 MG 24 hr capsule, TAKE 1 CAPSULE BY MOUTH ONCE DAILY, Disp: 30 capsule, Rfl: 0 .  amphetamine-dextroamphetamine (ADDERALL XR) 15 MG 24 hr capsule, Take 1 capsule by mouth every morning., Disp: 7 capsule, Rfl: 0 .  amphetamine-dextroamphetamine (ADDERALL XR) 20 MG 24 hr capsule, TAKE 1 CAPSULE BY MOUTH ONCE DAILY, Disp: 30 capsule, Rfl: 0 .  amphetamine-dextroamphetamine (ADDERALL XR) 20 MG 24 hr capsule, TAKE 1 CAPSULE BY MOUTH ONCE DAILY, Disp: 30 capsule, Rfl: 0 .  amphetamine-dextroamphetamine (ADDERALL XR) 20 MG 24 hr capsule, TAKE 1 CAPSULE BY MOUTH ONCE DAILY, Disp: 30 capsule, Rfl: 0 .  amphetamine-dextroamphetamine (ADDERALL) 10 MG tablet, TAKE 1 TABLET BY MOUTH TWICE DAILY WITH MEALS., Disp: 60 tablet, Rfl: 0 .  amphetamine-dextroamphetamine (ADDERALL) 10 MG tablet, TAKE 1 TABLET BY MOUTH TWICE DAILY WITH MEALS FILL 02/11/19, Disp: 60 tablet, Rfl: 0 .  clonazePAM (KLONOPIN) 0.25 MG disintegrating tablet, Take 1 tablet (0.25 mg total) by mouth daily as needed. Keep on file for future refills, Disp: 30 tablet, Rfl: 5 .  cyclobenzaprine (  FLEXERIL) 10 MG tablet, Take 1 tablet (10 mg total) by mouth 3 (three) times daily as needed for muscle spasms., Disp: 12 tablet, Rfl: 0 .  Dupilumab (DUPIXENT) 300 MG/2ML SOSY, Inject 300 mg into the skin every 14 (fourteen) days. (every 2 weeks) starting on day 15 as directed, Disp: 4 mL, Rfl: 11 .  metoprolol succinate (TOPROL-XL) 50 MG 24 hr tablet, Take 1 tablet (50 mg total) by mouth daily. Take with or immediately following a meal., Disp: 90 tablet, Rfl: 3 .  montelukast (SINGULAIR)  10 MG tablet, TAKE 1 TABLET (10 MG TOTAL) BY MOUTH AT BEDTIME., Disp: 90 tablet, Rfl: 3 .  triamcinolone ointment (KENALOG) 0.1 %, , Disp: , Rfl: 0 .  hydrOXYzine (ATARAX/VISTARIL) 50 MG tablet, Take 1 tablet (50 mg total) by mouth 3 (three) times daily as needed. (Patient not taking: Reported on 03/29/2019), Disp: 30 tablet, Rfl: 0  EXAM:  VITALS per patient if applicable:  GENERAL: alert, oriented, appears well and in no acute distress  HEENT: atraumatic, conjunttiva clear, no obvious abnormalities on inspection of external nose and ears  NECK: normal movements of the head and neck  LUNGS: on inspection no signs of respiratory distress, breathing rate appears normal, no obvious gross SOB, gasping or wheezing  CV: no obvious cyanosis  MS: moves all visible extremities without noticeable abnormality  PSYCH/NEURO: pleasant and cooperative, no obvious depression or anxiety, speech and thought processing grossly intact  ASSESSMENT AND PLAN:  Discussed the following assessment and plan:  Eczema, unspecified type  Generalized anxiety disorder  Obesity (BMI 30.0-34.9)  Eczema Managed by Dermatology with injectible immunotherapeutic agent  Dupilumab NOW INJECTED every  18 days. Continue  Allegra,  singulair stopped due to side effect of sedation .  Does not require lab surveillance   Generalized anxiety disorder Well controlled on current regimen. , no changes today. Clonazepam for use prn and refilled .  The risks and benefits of benzodiazepine use were reviewed with patient today including excessive sedation leading to respiratory depression,  impaired thinking/driving,  Addiction and dementia   Patient was advised to avoid concurrent use with alcohol, to use medication only as needed and not to share with others  .    Obesity (BMI 30.0-34.9) His weight has been "up ad down: for the past several months aggravated by pandemic and holidays.  encoraged to start an exercise program     I discussed the assessment and treatment plan with the patient. The patient was provided an opportunity to ask questions and all were answered. The patient agreed with the plan and demonstrated an understanding of the instructions.   The patient was advised to call back or seek an in-person evaluation if the symptoms worsen or if the condition fails to improve as anticipated.   I provided  20  minutes of non-face-to-face time during this encounter reviewing patient's current problems and past procedures/imaging studies, providing counseling on the above mentioned problems , and coordination  of care .  Crecencio Mc, MD

## 2019-10-03 NOTE — Assessment & Plan Note (Signed)
Managed by Dermatology with injectible immunotherapeutic agent  Dupilumab NOW INJECTED every  18 days. Continue  Allegra,  singulair stopped due to side effect of sedation .  Does not require lab surveillance

## 2019-10-03 NOTE — Assessment & Plan Note (Signed)
Well controlled on current regimen. , no changes today. Clonazepam for use prn and refilled .  The risks and benefits of benzodiazepine use were reviewed with patient today including excessive sedation leading to respiratory depression,  impaired thinking/driving,  Addiction and dementia   Patient was advised to avoid concurrent use with alcohol, to use medication only as needed and not to share with others  .  ? ?

## 2019-10-07 MED ORDER — AMPHETAMINE-DEXTROAMPHET ER 15 MG PO CP24: 15.0000 mg | ORAL_CAPSULE | ORAL | 0 refills | Status: DC

## 2019-10-07 MED ORDER — AMPHETAMINE-DEXTROAMPHETAMINE 10 MG PO TABS: ORAL_TABLET | ORAL | 0 refills | Status: DC

## 2019-10-25 MED FILL — DUPIXENT 300 MG/2 ML SAFE S: 300 | 28 days supply | Qty: 4 | Fill #11

## 2019-11-24 ENCOUNTER — Other Ambulatory Visit: Payer: Self-pay | Admitting: Pharmacist

## 2019-11-24 MED ORDER — DUPIXENT 300 MG/2ML ~~LOC~~ SOSY: 300.0000 mg | PREFILLED_SYRINGE | SUBCUTANEOUS | 11 refills | Status: DC

## 2019-11-27 MED FILL — DUPIXENT 300 MG/2 ML SAFE S: 300 | 28 days supply | Qty: 4 | Fill #0

## 2019-12-27 ENCOUNTER — Other Ambulatory Visit: Payer: Self-pay | Admitting: Internal Medicine

## 2019-12-27 MED ORDER — CLONAZEPAM 0.25 MG PO TBDP: 0.2500 mg | ORAL_TABLET | Freq: Every day | ORAL | 0 refills | Status: DC | PRN

## 2019-12-27 NOTE — Telephone Encounter (Signed)
Refilled

## 2019-12-27 NOTE — Telephone Encounter (Signed)
Refill request for [place], last seen 10-12-19, last filled adderall- 10-07-19 and klonopin-12-18-18 .  Please advise.

## 2020-01-03 ENCOUNTER — Ambulatory Visit (HOSPITAL_BASED_OUTPATIENT_CLINIC_OR_DEPARTMENT_OTHER): Payer: No Typology Code available for payment source | Admitting: Pharmacist

## 2020-01-03 ENCOUNTER — Other Ambulatory Visit: Payer: Self-pay

## 2020-01-03 ENCOUNTER — Encounter: Payer: Self-pay | Admitting: Pharmacist

## 2020-01-03 DIAGNOSIS — Z79899 Other long term (current) drug therapy: Secondary | ICD-10-CM

## 2020-01-03 NOTE — Progress Notes (Signed)
   S: Patient presents for review of their specialty medication therapy.  Patient is currently taking Dupixent for atopic dermatitis . Patient is managed by [facility] for this.   Adherence: denies any missed doses  Efficacy: reports that it continues to work well for him and denies any adverse effects  Dosing: 300 mg every 2 weeks  Monitoring: S/sx of infection: denies Hypersensitivity reaction: denies Ocular effects: denies S/sx of eosinophilia/vasculitis: denies  O:      Lab Results  Component Value Date   WBC 8.6 11/03/2017   HGB 14.7 11/03/2017   HCT 42.9 11/03/2017   MCV 92.0 11/03/2017   PLT 322.0 11/03/2017      Chemistry      Component Value Date/Time   NA 140 03/16/2019 0849   NA 138 04/27/2014 0741   NA 138 04/27/2014 0741   K 4.5 03/16/2019 0849   K 4.1 04/27/2014 0741   CL 106 03/16/2019 0849   CL 104 04/27/2014 0741   CO2 27 03/16/2019 0849   CO2 26 04/27/2014 0741   BUN 15 03/16/2019 0849   BUN 19 04/27/2014 0741   BUN 19 (H) 04/27/2014 0741   CREATININE 0.89 03/16/2019 0849   CREATININE 1.12 04/27/2014 0741   GLU 102 04/27/2014 0741      Component Value Date/Time   CALCIUM 8.8 03/16/2019 0849   CALCIUM 8.7 04/27/2014 0741   ALKPHOS 70 03/16/2019 0849   ALKPHOS 78 04/27/2014 0741   AST 29 03/16/2019 0849   AST 25 04/27/2014 0741   ALT 46 03/16/2019 0849   ALT 40 04/27/2014 0741   BILITOT 0.4 03/16/2019 0849   BILITOT 0.6 04/27/2014 0741       A/P: 1. Medication review: Patient currently on [place] for atopic dermatitis and is tolerating it well with no adverse effects and good control of his atopic dermatitis. Reviewed medication with patient, including the following: Dupixent is a monoclonal antibody used for atopic dermatitis and asthma. Possible adverse effects include increased risk of infection, ocular effects, injection site reactions, and eosinophilia/vasculitis. No recommendations for any changes.  Benard Halsted, PharmD,  [place] [phone]

## 2020-01-04 MED FILL — DUPIXENT 300 MG/2 ML SAFE S: 300 | 28 days supply | Qty: 4 | Fill #1

## 2020-02-06 MED FILL — DUPIXENT 300 MG/2 ML SAFE S: 300 | 28 days supply | Qty: 4 | Fill #2

## 2020-03-06 MED FILL — DUPIXENT 300 MG/2 ML SAFE S: 300 | 28 days supply | Qty: 4 | Fill #3

## 2020-04-11 MED FILL — DUPIXENT 300 MG/2 ML SAFE S: 300 | 28 days supply | Qty: 4 | Fill #4

## 2020-04-25 ENCOUNTER — Telehealth: Payer: Self-pay | Admitting: Internal Medicine

## 2020-04-25 ENCOUNTER — Other Ambulatory Visit: Payer: Self-pay | Admitting: Internal Medicine

## 2020-04-25 NOTE — Telephone Encounter (Signed)
.  Please notify patient that the prescriptions was  Refilled for 30 days only because they are controlled substances and require a  6 month follow up and  Is needed

## 2020-04-25 NOTE — Telephone Encounter (Signed)
Refill request for adderall, last seen 10-03-19, last filled 12-27-19.  Please advise.

## 2020-04-25 NOTE — Telephone Encounter (Signed)
Patient was notified and appointment has been scheduled.

## 2020-04-30 ENCOUNTER — Encounter: Payer: Self-pay | Admitting: Internal Medicine

## 2020-04-30 ENCOUNTER — Telehealth (INDEPENDENT_AMBULATORY_CARE_PROVIDER_SITE_OTHER): Payer: No Typology Code available for payment source | Admitting: Internal Medicine

## 2020-04-30 VITALS — BP 120/78 | Ht 66.0 in | Wt 210.0 lb

## 2020-04-30 DIAGNOSIS — E782 Mixed hyperlipidemia: Secondary | ICD-10-CM

## 2020-04-30 DIAGNOSIS — F900 Attention-deficit hyperactivity disorder, predominantly inattentive type: Secondary | ICD-10-CM

## 2020-04-30 DIAGNOSIS — I10 Essential (primary) hypertension: Secondary | ICD-10-CM | POA: Diagnosis not present

## 2020-04-30 DIAGNOSIS — E66811 Obesity, class 1: Secondary | ICD-10-CM

## 2020-04-30 DIAGNOSIS — R748 Abnormal levels of other serum enzymes: Secondary | ICD-10-CM

## 2020-04-30 DIAGNOSIS — L309 Dermatitis, unspecified: Secondary | ICD-10-CM

## 2020-04-30 DIAGNOSIS — E669 Obesity, unspecified: Secondary | ICD-10-CM

## 2020-04-30 NOTE — Assessment & Plan Note (Signed)
Improving with use of pine tar/charcoal soap and Dupixent

## 2020-04-30 NOTE — Assessment & Plan Note (Signed)
Improved with diet and exercise.  Due for repeat  

## 2020-04-30 NOTE — Progress Notes (Signed)
Virtual Visit converted to Telephone note  This visit type was conducted due to national recommendations for restrictions regarding the COVID-19 pandemic (e.g. social distancing).  This format is felt to be most appropriate for this patient at this time.  All issues noted in this document were discussed and addressed.  No physical exam was performed (except for noted visual exam findings with Video Visits).   I connected with@ on 04/30/20 at  4:30 PM EDT by a video enabled telemedicine application .   Interactive audio and video telecommunications were initially established beteen this provider and patient, however ultimately failed, due to patient having technical difficulties. We continued and completed visit with audio only  and verified that I am speaking with the correct person using two identifiersor telephone and verified that I am speaking with the correct person using two identifiers. Location patient: home Location provider: work or home office Persons participating in the virtual visit: patient, provider  I discussed the limitations, risks, security and privacy concerns of performing an evaluation and management service by telephone and the availability of in person appointments. I also discussed with the patient that there may be a patient responsible charge related to this service. The patient expressed understanding and agreed to proceed.  Reason for visit: follow up on hypertension, ADD , and obesity   HPI:  Dermatitis using Dupexent and pine tar and charcoal Soap  . Skin is feeling and looking the best it has in 2 years  ADD:  From a cardiac standpoint,  the lower XL dose is tolerated better, but he notes that his concentration has suffered mildly.  Avoids the 10 mg afternoon dose as much as possible.    Hypertension: patient checks blood pressure twice weekly at home.  Readings have been for the most part < 130/80 at rest . Patient is following a reduced salt diet most days and  is taking medications as prescribed:  Metoprolol 50 mg daily tolerating it well     Obesity:  Weight is stable. Trying to lose weight on the INtermittent Fasting Diet . Waking his dog for more walks/    ROS: See pertinent positives and negatives per HPI.  No past medical history on file.  Past Surgical History:  Procedure Laterality Date   HERNIA REPAIR     SMALL INTESTINE SURGERY      Family History  Problem Relation Age of Onset   Arthritis Mother    Lung cancer Mother    Heart disease Maternal Grandmother    Heart disease Maternal Grandfather     SOCIAL HX: . reports that he quit smoking about 8 years ago. He has never used smokeless tobacco. He reports current alcohol use of about 7.0 standard drinks of alcohol per week. He reports that he does not use drugs.   Current Outpatient Medications:    ADDERALL XR 15 MG 24 hr capsule, TAKE 1 CAPSULE BY MOUTH EVERY MORNING., Disp: 30 capsule, Rfl: 0   amphetamine-dextroamphetamine (ADDERALL) 10 MG tablet, TAKE 1 TABLET BY MOUTH iIN THE AFTERNOON, Disp: 30 tablet, Rfl: 0   clonazePAM (KLONOPIN) 0.25 MG disintegrating tablet, Take 1 tablet (0.25 mg total) by mouth daily as needed., Disp: 30 tablet, Rfl: 0   dupilumab (DUPIXENT) 300 MG/2ML prefilled syringe, Inject 300 mg into the skin every 14 (fourteen) days. Starting on day 15 as directed., Disp: 4 mL, Rfl: 11   metoprolol succinate (TOPROL-XL) 50 MG 24 hr tablet, Take 1 tablet (50 mg total) by mouth daily. Take  with or immediately following a meal., Disp: 90 tablet, Rfl: 3   triamcinolone ointment (KENALOG) 0.1 %, , Disp: , Rfl: 0  EXAM:   General impression: alert, cooperative and articulate.  No signs of being in distress  Lungs: speech is fluent sentence length suggests that patient is not short of breath and not punctuated by cough, sneezing or sniffing. Marland Kitchen   Psych: affect normal.  speech is articulate and non pressured .  Denies suicidal thoughts   ASSESSMENT AND  PLAN:  Discussed the following assessment and plan:  Essential hypertension - Plan: Lipid panel, Microalbumin / creatinine urine ratio  Elevated liver enzymes - Plan: Comprehensive metabolic panel, TSH  Attention deficit hyperactivity disorder (ADHD), predominantly inattentive type  Elevated triglycerides with high cholesterol  Eczema, unspecified type  Obesity (BMI 30.0-34.9)  Attention deficit hyperactivity disorder (ADHD), predominantly inattentive type Continue Adderall XR 15 mg daily.  Can try adding 5 mg of short acting in the early afternoon  To help concentration  Elevated triglycerides with high cholesterol Improved with diet and exercise.  Due for repeat   Hypertension .Well controlled on metoprolol 50 mg daily.  Checking urine for protein  Renal function due, no changes today.  Eczema Improving with use of pine tar/charcoal soap and Dupixent   Obesity (BMI 30.0-34.9) His weight has been stable,  He is trying to lose weight using the intermittent fasting diet and starting a walking program     I discussed the assessment and treatment plan with the patient. The patient was provided an opportunity to ask questions and all were answered. The patient agreed with the plan and demonstrated an understanding of the instructions.   The patient was advised to call back or seek an in-person evaluation if the symptoms worsen or if the condition fails to improve as anticipated.  I provided  15 minutes of non-face-to-face time during this encounter.   Crecencio Mc, MD

## 2020-04-30 NOTE — Assessment & Plan Note (Signed)
His weight has been stable,  He is trying to lose weight using the intermittent fasting diet and starting a walking program

## 2020-04-30 NOTE — Assessment & Plan Note (Signed)
Continue Adderall XR 15 mg daily.  Can try adding 5 mg of short acting in the early afternoon  To help concentration

## 2020-04-30 NOTE — Assessment & Plan Note (Addendum)
.  Well controlled on metoprolol 50 mg daily.  Checking urine for protein  Renal function due, no changes today.

## 2020-05-06 MED FILL — DUPIXENT 300 MG/2 ML SAFE S: 300 | 28 days supply | Qty: 4 | Fill #5

## 2020-06-10 ENCOUNTER — Other Ambulatory Visit: Payer: Self-pay

## 2020-06-10 ENCOUNTER — Other Ambulatory Visit (INDEPENDENT_AMBULATORY_CARE_PROVIDER_SITE_OTHER): Payer: No Typology Code available for payment source

## 2020-06-10 DIAGNOSIS — R748 Abnormal levels of other serum enzymes: Secondary | ICD-10-CM | POA: Diagnosis not present

## 2020-06-10 DIAGNOSIS — I10 Essential (primary) hypertension: Secondary | ICD-10-CM | POA: Diagnosis not present

## 2020-06-10 LAB — COMPREHENSIVE METABOLIC PANEL
ALT: 78 U/L — ABNORMAL HIGH (ref 0–53)
AST: 64 U/L — ABNORMAL HIGH (ref 0–37)
Albumin: 4.6 g/dL (ref 3.5–5.2)
Alkaline Phosphatase: 65 U/L (ref 39–117)
BUN: 15 mg/dL (ref 6–23)
CO2: 26 mEq/L (ref 19–32)
Calcium: 9.4 mg/dL (ref 8.4–10.5)
Chloride: 103 mEq/L (ref 96–112)
Creatinine, Ser: 1.05 mg/dL (ref 0.40–1.50)
GFR: 79.91 mL/min (ref >60.00)
Glucose, Bld: 97 mg/dL (ref 70–99)
Potassium: 4.4 mEq/L (ref 3.5–5.1)
Sodium: 138 mEq/L (ref 135–145)
Total Bilirubin: 0.7 mg/dL (ref 0.2–1.2)
Total Protein: 7.3 g/dL (ref 6.0–8.3)

## 2020-06-10 LAB — LIPID PANEL
Cholesterol: 215 mg/dL — ABNORMAL HIGH (ref 0–200)
HDL: 40.8 mg/dL (ref >39.00)
LDL Cholesterol: 144 mg/dL — ABNORMAL HIGH (ref 0–99)
NonHDL: 174.01
Total CHOL/HDL Ratio: 5
Triglycerides: 151 mg/dL — ABNORMAL HIGH (ref 0.0–149.0)
VLDL: 30.2 mg/dL (ref 0.0–40.0)

## 2020-06-10 LAB — TSH: TSH: 3.44 u[IU]/mL (ref 0.35–4.50)

## 2020-06-11 LAB — MICROALBUMIN / CREATININE URINE RATIO
Creatinine,U: 359.8 mg/dL
Microalb Creat Ratio: 0.4 mg/g (ref 0.0–30.0)
Microalb, Ur: 1.3 mg/dL (ref 0.0–1.9)

## 2020-06-12 MED FILL — DUPIXENT 300 MG/2 ML SAFE S: 300 | 28 days supply | Qty: 4 | Fill #6

## 2020-06-15 NOTE — Progress Notes (Signed)
Your cholesterol and your liver enzymes are elevated.  Please schedule a visit to discuss further workup and management.  (It can be virtual if that is more convenient for you.)  Regards,   Deborra Medina, MD

## 2020-07-03 MED FILL — DUPIXENT 300 MG/2 ML SAFE S: 300 | 28 days supply | Qty: 4 | Fill #7

## 2020-08-01 MED FILL — DUPIXENT 300 MG/2 ML SAFE S: 300 | 28 days supply | Qty: 4 | Fill #8

## 2020-09-04 ENCOUNTER — Other Ambulatory Visit: Payer: Self-pay | Admitting: Internal Medicine

## 2020-09-04 NOTE — Telephone Encounter (Signed)
RX Refill:Adderral Last Seen:04-30-20 Last ordered: 04-25-20

## 2020-09-09 MED FILL — DUPIXENT 300 MG/2 ML SAFE S: 300 | 28 days supply | Qty: 4 | Fill #9

## 2020-10-03 MED FILL — DUPIXENT 300 MG/2 ML SAFE S: 300 | 28 days supply | Qty: 4 | Fill #10

## 2020-10-30 ENCOUNTER — Other Ambulatory Visit: Payer: Self-pay | Admitting: Dermatology

## 2020-10-31 MED FILL — DUPIXENT 300 MG/2 ML SAFE S: 300 | 28 days supply | Qty: 4 | Fill #11

## 2020-11-26 ENCOUNTER — Other Ambulatory Visit: Payer: Self-pay | Admitting: Pharmacist

## 2020-11-26 MED ORDER — DUPIXENT 300 MG/2ML ~~LOC~~ SOSY: PREFILLED_SYRINGE | SUBCUTANEOUS | 11 refills | Status: DC

## 2020-11-28 MED FILL — DUPIXENT 300 MG/2 ML SAFE S: 300 | 28 days supply | Qty: 4 | Fill #0

## 2020-12-17 ENCOUNTER — Other Ambulatory Visit (HOSPITAL_COMMUNITY): Payer: Self-pay

## 2020-12-31 ENCOUNTER — Ambulatory Visit (INDEPENDENT_AMBULATORY_CARE_PROVIDER_SITE_OTHER): Payer: No Typology Code available for payment source | Admitting: Internal Medicine

## 2020-12-31 ENCOUNTER — Encounter: Payer: Self-pay | Admitting: Internal Medicine

## 2020-12-31 ENCOUNTER — Other Ambulatory Visit: Payer: Self-pay

## 2020-12-31 VITALS — BP 130/90 | HR 81 | Temp 98.2°F | Resp 15 | Ht 66.0 in | Wt 232.8 lb

## 2020-12-31 DIAGNOSIS — F411 Generalized anxiety disorder: Secondary | ICD-10-CM

## 2020-12-31 DIAGNOSIS — I10 Essential (primary) hypertension: Secondary | ICD-10-CM

## 2020-12-31 DIAGNOSIS — E782 Mixed hyperlipidemia: Secondary | ICD-10-CM | POA: Diagnosis not present

## 2020-12-31 DIAGNOSIS — E559 Vitamin D deficiency, unspecified: Secondary | ICD-10-CM

## 2020-12-31 DIAGNOSIS — R7301 Impaired fasting glucose: Secondary | ICD-10-CM

## 2020-12-31 DIAGNOSIS — L209 Atopic dermatitis, unspecified: Secondary | ICD-10-CM

## 2020-12-31 DIAGNOSIS — E669 Obesity, unspecified: Secondary | ICD-10-CM

## 2020-12-31 DIAGNOSIS — J301 Allergic rhinitis due to pollen: Secondary | ICD-10-CM

## 2020-12-31 DIAGNOSIS — R748 Abnormal levels of other serum enzymes: Secondary | ICD-10-CM | POA: Diagnosis not present

## 2020-12-31 DIAGNOSIS — F900 Attention-deficit hyperactivity disorder, predominantly inattentive type: Secondary | ICD-10-CM

## 2020-12-31 DIAGNOSIS — Z0000 Encounter for general adult medical examination without abnormal findings: Secondary | ICD-10-CM | POA: Diagnosis not present

## 2020-12-31 MED ORDER — METOPROLOL SUCCINATE ER 50 MG PO TB24
50.0000 mg | ORAL_TABLET | Freq: Every day | ORAL | 3 refills | Status: DC
  Filled 2020-12-31: qty 90, 90d supply, fill #0
  Filled 2021-02-10 – 2021-05-08 (×2): qty 90, 90d supply, fill #1

## 2020-12-31 MED ORDER — AMLODIPINE BESYLATE 2.5 MG PO TABS
2.5000 mg | ORAL_TABLET | Freq: Every day | ORAL | 3 refills | Status: DC
Start: 2020-12-31 — End: 2022-03-04
  Filled 2020-12-31: qty 90, 90d supply, fill #0
  Filled 2021-02-10 – 2021-05-08 (×2): qty 90, 90d supply, fill #1

## 2020-12-31 MED ORDER — AMPHETAMINE-DEXTROAMPHETAMINE 10 MG PO TABS
ORAL_TABLET | ORAL | 0 refills | Status: DC
  Filled 2020-12-31: qty 30, 30d supply, fill #0

## 2020-12-31 MED ORDER — AMPHETAMINE-DEXTROAMPHET ER 10 MG PO CP24
10.0000 mg | ORAL_CAPSULE | Freq: Every day | ORAL | 0 refills | Status: DC
  Filled 2020-12-31: qty 30, 30d supply, fill #0

## 2020-12-31 MED ORDER — MONTELUKAST SODIUM 10 MG PO TABS
10.0000 mg | ORAL_TABLET | Freq: Every day | ORAL | 1 refills | Status: DC
  Filled 2020-12-31: qty 90, 90d supply, fill #0

## 2020-12-31 MED ORDER — CLONAZEPAM 0.25 MG PO TBDP
0.2500 mg | ORAL_TABLET | Freq: Every day | ORAL | 0 refills | Status: DC | PRN
  Filled 2020-12-31: qty 30, 30d supply, fill #0

## 2020-12-31 NOTE — Patient Instructions (Addendum)
generic zyrtec, which is cetirizine.   Allegra is availabel generically as fexofenadine and it comes in 60 mg and 180 mg once daily strengths.   The claritin is also available generically as loratidine .   Ok to use twice daily   Adding singulair once daily   adderall xr and IR  10 mg doses sent to [facility]    Adding 2.5mg  amlodipine for blood pressure not at goal. (120/70 ideally).  Continue metoprolol  the [employer] diet works for anybody.  The diet utilizes 5 feedings per day which are 100 calorie meals chosen and purchased from their menu,  and one "lean and green meal"  that you supply on your own for dinner.  The cost , per my patients,  is $400 month for the food you order through them.  (one month , 30 x 5 = 150 meals)  . This would double if both of you  and your husband did it.  Many people are finding the cost prohibitive and creating their own menu from locally available prepared foods,, shakes and protein bars.

## 2020-12-31 NOTE — Progress Notes (Signed)
Patient ID: Christopher Terrell, male    DOB: 1986-03-27  Age: 35 y.o. MRN: [id number]  The patient is here for annual preventive  examination and management of other chronic and acute problems.   The risk factors are reflected in the social history.  The roster of all physicians providing medical care to patient - is listed in the Snapshot section of the chart.  Activities of daily living:  The patient is 100% independent in all ADLs: dressing, toileting, feeding as well as independent mobility  Home safety : The patient has smoke detectors in the home. They wear seatbelts.  There are no firearms at home. There is no violence in the home.   There is no risks for hepatitis, STDs or HIV. There is no   history of blood transfusion. They have no travel history to infectious disease endemic areas of the world.  The patient has seen their dentist in the last six month. They have seen their eye doctor in the last year. They admit to slight hearing difficulty with regard to whispered voices and some television programs.  They have deferred audiologic testing in the last year.  They do not  have excessive sun exposure. Discussed the need for sun protection: hats, long sleeves and use of sunscreen if there is significant sun exposure.   Diet: the importance of a healthy diet is discussed. He does not have a healthy diet. He has gained 30 lbs in the last 3 years.   The benefits of regular aerobic exercise were discussed. He is not exercising   Depression screen: there are no signs or vegative symptoms of untreated depression- irritability, change in appetite, anhedonia, sadness/tearfullness.  Cognitive assessment: the patient is  Managing his financial and personal affairs and is actively engaged. He has ADD that is medically managed with prescribed stimulants,  The following portions of the patient's history were reviewed and updated as appropriate: allergies, current medications, past family history, past  medical history,  past surgical history, past social history  and problem list.  Visual acuity was not assessed per patient preference since she has regular follow up with her ophthalmologist. Hearing and body mass index were assessed and reviewed.   During the course of the visit the patient was educated and counseled about appropriate screening and preventive services including : fall prevention , diabetes screening, nutrition counseling, colorectal cancer screening, and recommended immunizations.    CC: The primary encounter diagnosis was Elevated triglycerides with high cholesterol. Diagnoses of Elevated liver enzymes, Vitamin D deficiency, Primary hypertension, Atopic dermatitis, unspecified type, Impaired fasting glucose, Attention deficit hyperactivity disorder (ADHD), predominantly inattentive type, Encounter for preventive health examination, Generalized anxiety disorder, Obesity (BMI 30.0-34.9), and Seasonal allergic rhinitis due to pollen were also pertinent to this visit.  1) pollen aggravating his rhinitis .    2) ADD: has been untreated since October due to being lost to follow up.  Discussed resuming regimen of XR  10 mg once daily during week /work.  Using the short acting for after work and on weekends    History Christopher Terrell has no past medical history on file.   He has a past surgical history that includes Small intestine surgery and Hernia repair.   His family history includes Arthritis in his mother; Heart disease in his maternal grandfather and maternal grandmother; Lung cancer in his mother.He reports that he quit smoking about 9 years ago. He has never used smokeless tobacco. He reports current alcohol use of about 7.0 standard  drinks of alcohol per week. He reports that he does not use drugs.  Outpatient Medications Prior to Visit  Medication Sig Dispense Refill  . clobetasol ointment (TEMOVATE) 0.05 % APPLY TWICE DAILY TO THICKER AFFECTED AREAS UNTIL CLEAR 45 g 1  .  dupilumab (DUPIXENT) 300 MG/2ML prefilled syringe INJECT 300 MG (1 SYRINGE) UNDER THE SKIN EVERY 2 WEEKS STARTING ON DAY 15 AS DIRECTED. 4 mL 11  . triamcinolone ointment (KENALOG) 0.1 %   0  . amphetamine-dextroamphetamine (ADDERALL XR) 15 MG 24 hr capsule TAKE 1 CAPSULE BY MOUTH EVERY MORNING. 30 capsule 0  . amphetamine-dextroamphetamine (ADDERALL) 10 MG tablet TAKE 1 TABLET BY MOUTH iIN THE AFTERNOON 30 tablet 0  . clonazePAM (KLONOPIN) 0.25 MG disintegrating tablet Take 1 tablet (0.25 mg total) by mouth daily as needed. 30 tablet 0  . metoprolol succinate (TOPROL-XL) 50 MG 24 hr tablet Take 1 tablet (50 mg total) by mouth daily. Take with or immediately following a meal. 90 tablet 3   No facility-administered medications prior to visit.    Review of Systems   Patient denies headache, fevers, malaise, unintentional weight loss, skin rash, eye pain, sinus congestion and sinus pain, sore throat, dysphagia,  hemoptysis , cough, dyspnea, wheezing, chest pain, palpitations, orthopnea, edema, abdominal pain, nausea, melena, diarrhea, constipation, flank pain, dysuria, hematuria, urinary  Frequency, nocturia, numbness, tingling, seizures,  Focal weakness, Loss of consciousness,  Tremor, insomnia, depression, anxiety, and suicidal ideation.     Objective:  BP 130/90 (BP Location: Left Arm, Patient Position: Sitting, Cuff Size: Large)   Pulse 81   Temp 98.2 F (36.8 C) (Oral)   Resp 15   Ht 5\' 6"  (1.676 m)   Wt 232 lb 12.8 oz (105.6 kg)   SpO2 98%   BMI 37.57 kg/m   Physical Exam  General appearance: alert, cooperative and appears stated age Ears: normal TM's and external ear canals both ears Throat: lips, mucosa, and tongue normal; teeth and gums normal Neck: no adenopathy, no carotid bruit, supple, symmetrical, trachea midline and thyroid not enlarged, symmetric, no tenderness/mass/nodules Back: symmetric, no curvature. ROM normal. No CVA tenderness. Lungs: clear to auscultation  bilaterally Heart: regular rate and rhythm, S1, S2 normal, no murmur, click, rub or gallop Abdomen: soft, non-tender; bowel sounds normal; no masses,  no organomegaly Pulses: 2+ and symmetric Skin: Skin color, texture, turgor normal. No rashes or lesions Lymph nodes: Cervical, supraclavicular, and axillary nodes normal.  Assessment & Plan:   Problem List Items Addressed This Visit      Unprioritized   Attention deficit hyperactivity disorder (ADHD), predominantly inattentive type    Currently untreated due to being lost to follow up.  Will resume medication at a lower dose of Adderall XR 10 mg daily.  With 10  mg of short acting adderall as needed for weekend or evening work  To help concentration      Elevated liver enzymes    Pattern suggested fatty liver given prior  normal serologic workup and weight gain.  Periodic overuse of Alcohol identified and addressed; repeat enzymes are due   Lab Results  Component Value Date   ALT 78 (H) 06/10/2020   AST 64 (H) 06/10/2020   ALKPHOS 65 06/10/2020   BILITOT 0.7 06/10/2020         Relevant Orders   Comprehensive metabolic panel   Elevated triglycerides with high cholesterol - Primary   Relevant Medications   amLODipine (NORVASC) 2.5 MG tablet   metoprolol succinate (TOPROL-XL) 50  MG 24 hr tablet   Other Relevant Orders   Lipid panel   TSH   Encounter for preventive health examination    age appropriate education and counseling updated, referrals for preventative services and immunizations addressed, dietary and smoking counseling addressed, most recent labs reviewed.  I have personally reviewed and have noted:  1) the patient's medical and social history 2) The pt's use of alcohol, tobacco, and illicit drugs 3) The patient's current medications and supplements 4) Functional ability including ADL's, fall risk, home safety risk, hearing and visual impairment 5) Diet and physical activities 6) Evidence for depression or mood  disorder 7) The patient's height, weight, and BMI have been recorded in the chart  I have made referrals, and provided counseling and education based on review of the above      Generalized anxiety disorder    Well controlled on current regimen. , no changes today. Clonazepam for use prn and refilled .  The risks and benefits of benzodiazepine use were reviewed with patient today including excessive sedation leading to respiratory depression,  impaired thinking/driving,  Addiction and dementia   Patient was advised to avoid concurrent use with alcohol, to use medication only as needed and not to share with others  .        Hypertension    Not at well controlled on metoprolol 50 mg daily;  adding amlodipine 2.5 mg daily .       Relevant Medications   amLODipine (NORVASC) 2.5 MG tablet   metoprolol succinate (TOPROL-XL) 50 MG 24 hr tablet   Other Relevant Orders   Microalbumin / creatinine urine ratio   Obesity (BMI 30.0-34.9)    Reviewed his weight gain of 30 ls over the last 3 years. Reviewed options for management .  He prefers to try  intermittent fasting diet and starting a walking program .  Screening for diabetes, hyperlipidemia and thyroid dysfunction needed.        Seasonal allergic rhinitis    Adding singulair to current regimen of flonase and antihistamine        Vitamin D deficiency   Relevant Orders   VITAMIN D 25 Hydroxy (Vit-D Deficiency, Fractures)    Other Visit Diagnoses    Atopic dermatitis, unspecified type       Relevant Orders   Ambulatory referral to Dermatology   Impaired fasting glucose       Relevant Orders   Hemoglobin A1c      I have discontinued Christopher Terrell. Christopher Terrell's amphetamine-dextroamphetamine. I have also changed his amphetamine-dextroamphetamine. Additionally, I am having him start on montelukast, amphetamine-dextroamphetamine, and amLODipine. Lastly, I am having him maintain his triamcinolone ointment, clobetasol ointment, dupilumab,  metoprolol succinate, and clonazePAM.  Meds ordered this encounter  Medications  . montelukast (SINGULAIR) 10 MG tablet    Sig: Take 1 tablet (10 mg total) by mouth at bedtime.    Dispense:  90 tablet    Refill:  1  . amphetamine-dextroamphetamine (ADDERALL XR) 10 MG 24 hr capsule    Sig: Take 1 capsule (10 mg total) by mouth daily.    Dispense:  30 capsule    Refill:  0  . amphetamine-dextroamphetamine (ADDERALL) 10 MG tablet    Sig: TAKE 1 TABLET BY MOUTH IN THE AFTERNOON    Dispense:  30 tablet    Refill:  0    PATIENT TAKING WITH 10 MG XR DOSE DAILY  . amLODipine (NORVASC) 2.5 MG tablet    Sig: Take 1 tablet (2.5 mg  total) by mouth daily.    Dispense:  90 tablet    Refill:  3  . metoprolol succinate (TOPROL-XL) 50 MG 24 hr tablet    Sig: Take 1 tablet (50 mg total) by mouth daily. Take with or immediately following a meal.    Dispense:  90 tablet    Refill:  3  . clonazePAM (KLONOPIN) 0.25 MG disintegrating tablet    Sig: Take 1 tablet (0.25 mg total) by mouth daily as needed.    Dispense:  30 tablet    Refill:  0    KEEP ON FILE FOR FUTURE REFILLS    Medications Discontinued During This Encounter  Medication Reason  . amphetamine-dextroamphetamine (ADDERALL) 10 MG tablet   . amphetamine-dextroamphetamine (ADDERALL XR) 15 MG 24 hr capsule   . metoprolol succinate (TOPROL-XL) 50 MG 24 hr tablet Reorder  . clonazePAM (KLONOPIN) 0.25 MG disintegrating tablet Reorder    Follow-up: No follow-ups on file.   Crecencio Mc, MD

## 2021-01-01 ENCOUNTER — Other Ambulatory Visit: Payer: Self-pay

## 2021-01-01 ENCOUNTER — Telehealth: Payer: Self-pay | Admitting: Pharmacist

## 2021-01-01 DIAGNOSIS — J302 Other seasonal allergic rhinitis: Secondary | ICD-10-CM | POA: Insufficient documentation

## 2021-01-01 NOTE — Assessment & Plan Note (Signed)
Adding singulair to current regimen of flonase and antihistamine

## 2021-01-01 NOTE — Assessment & Plan Note (Signed)
Currently untreated due to being lost to follow up.  Will resume medication at a lower dose of Adderall XR 10 mg daily.  With 10  mg of short acting adderall as needed for weekend or evening work  To help concentration

## 2021-01-01 NOTE — Assessment & Plan Note (Addendum)
Not at well controlled on metoprolol 50 mg daily;  adding amlodipine 2.5 mg daily .

## 2021-01-01 NOTE — Assessment & Plan Note (Signed)

## 2021-01-01 NOTE — Assessment & Plan Note (Signed)
Well controlled on current regimen. , no changes today. Clonazepam for use prn and refilled .  The risks and benefits of benzodiazepine use were reviewed with patient today including excessive sedation leading to respiratory depression,  impaired thinking/driving,  Addiction and dementia   Patient was advised to avoid concurrent use with alcohol, to use medication only as needed and not to share with others  .

## 2021-01-01 NOTE — Assessment & Plan Note (Signed)
Pattern suggested fatty liver given prior  normal serologic workup and weight gain.  Periodic overuse of Alcohol identified and addressed; repeat enzymes are due   Lab Results  Component Value Date   ALT 78 (H) 06/10/2020   AST 64 (H) 06/10/2020   ALKPHOS 65 06/10/2020   BILITOT 0.7 06/10/2020

## 2021-01-01 NOTE — Assessment & Plan Note (Addendum)
Reviewed his weight gain of 30 ls over the last 3 years. Reviewed options for management .  He prefers to try  intermittent fasting diet and starting a walking program .  Screening for diabetes, hyperlipidemia and thyroid dysfunction needed.

## 2021-01-01 NOTE — Telephone Encounter (Signed)
Called patient to schedule an appointment for the [facility]. I was unable to reach the patient so I left a HIPAA-compliant message requesting that the patient return my call.   Luke Van Ausdall, PharmD, BCACP, CPP Clinical Pharmacist [facility] [phone]  

## 2021-01-06 ENCOUNTER — Other Ambulatory Visit (HOSPITAL_COMMUNITY): Payer: Self-pay

## 2021-01-06 MED FILL — Dupilumab Subcutaneous Soln Prefilled Syringe 300 MG/2ML: SUBCUTANEOUS | 28 days supply | Qty: 4 | Fill #0 | Status: AC

## 2021-01-07 ENCOUNTER — Other Ambulatory Visit: Payer: Self-pay

## 2021-01-07 ENCOUNTER — Other Ambulatory Visit (INDEPENDENT_AMBULATORY_CARE_PROVIDER_SITE_OTHER): Payer: No Typology Code available for payment source

## 2021-01-07 DIAGNOSIS — R7301 Impaired fasting glucose: Secondary | ICD-10-CM

## 2021-01-07 DIAGNOSIS — E559 Vitamin D deficiency, unspecified: Secondary | ICD-10-CM | POA: Diagnosis not present

## 2021-01-07 DIAGNOSIS — E782 Mixed hyperlipidemia: Secondary | ICD-10-CM

## 2021-01-07 DIAGNOSIS — I10 Essential (primary) hypertension: Secondary | ICD-10-CM

## 2021-01-07 DIAGNOSIS — R748 Abnormal levels of other serum enzymes: Secondary | ICD-10-CM | POA: Diagnosis not present

## 2021-01-07 LAB — COMPREHENSIVE METABOLIC PANEL
ALT: 62 U/L — ABNORMAL HIGH (ref 0–53)
AST: 39 U/L — ABNORMAL HIGH (ref 0–37)
Albumin: 4.2 g/dL (ref 3.5–5.2)
Alkaline Phosphatase: 77 U/L (ref 39–117)
BUN: 13 mg/dL (ref 6–23)
CO2: 27 mEq/L (ref 19–32)
Calcium: 9.4 mg/dL (ref 8.4–10.5)
Chloride: 105 mEq/L (ref 96–112)
Creatinine, Ser: 0.91 mg/dL (ref 0.40–1.50)
GFR: 108.36 mL/min (ref >60.00)
Glucose, Bld: 87 mg/dL (ref 70–99)
Potassium: 4.4 mEq/L (ref 3.5–5.1)
Sodium: 139 mEq/L (ref 135–145)
Total Bilirubin: 0.5 mg/dL (ref 0.2–1.2)
Total Protein: 7.1 g/dL (ref 6.0–8.3)

## 2021-01-07 LAB — MICROALBUMIN / CREATININE URINE RATIO
Creatinine,U: 238 mg/dL
Microalb Creat Ratio: 0.5 mg/g (ref 0.0–30.0)
Microalb, Ur: 1.2 mg/dL (ref 0.0–1.9)

## 2021-01-07 LAB — LIPID PANEL
Cholesterol: 178 mg/dL (ref 0–200)
HDL: 32 mg/dL — ABNORMAL LOW (ref >39.00)
LDL Cholesterol: 120 mg/dL — ABNORMAL HIGH (ref 0–99)
NonHDL: 146.39
Total CHOL/HDL Ratio: 6
Triglycerides: 132 mg/dL (ref 0.0–149.0)
VLDL: 26.4 mg/dL (ref 0.0–40.0)

## 2021-01-07 LAB — HEMOGLOBIN A1C: Hgb A1c MFr Bld: 5.5 % (ref 4.6–6.5)

## 2021-01-07 LAB — TSH: TSH: 1.92 u[IU]/mL (ref 0.35–4.50)

## 2021-01-07 LAB — VITAMIN D 25 HYDROXY (VIT D DEFICIENCY, FRACTURES): VITD: 18.25 ng/mL — ABNORMAL LOW (ref 30.00–100.00)

## 2021-01-09 ENCOUNTER — Other Ambulatory Visit: Payer: Self-pay | Admitting: Internal Medicine

## 2021-01-09 DIAGNOSIS — R748 Abnormal levels of other serum enzymes: Secondary | ICD-10-CM

## 2021-02-03 ENCOUNTER — Other Ambulatory Visit (HOSPITAL_COMMUNITY): Payer: Self-pay

## 2021-02-03 MED FILL — Dupilumab Subcutaneous Soln Prefilled Syringe 300 MG/2ML: SUBCUTANEOUS | 28 days supply | Qty: 4 | Fill #1 | Status: AC

## 2021-02-06 ENCOUNTER — Other Ambulatory Visit (HOSPITAL_COMMUNITY): Payer: Self-pay

## 2021-02-10 ENCOUNTER — Other Ambulatory Visit: Payer: Self-pay

## 2021-02-10 ENCOUNTER — Other Ambulatory Visit: Payer: Self-pay | Admitting: Internal Medicine

## 2021-02-10 MED ORDER — AMPHETAMINE-DEXTROAMPHETAMINE 10 MG PO TABS
ORAL_TABLET | ORAL | 0 refills | Status: DC
  Filled 2021-02-10: qty 30, 30d supply, fill #0

## 2021-02-10 MED ORDER — AMPHETAMINE-DEXTROAMPHET ER 10 MG PO CP24
10.0000 mg | ORAL_CAPSULE | Freq: Every day | ORAL | 0 refills | Status: DC
  Filled 2021-02-10: qty 30, 30d supply, fill #0

## 2021-02-10 NOTE — Telephone Encounter (Signed)
RX Refill:adderall Last Seen:12-31-20 Last ordered:12-31-20

## 2021-02-11 ENCOUNTER — Other Ambulatory Visit: Payer: Self-pay

## 2021-02-12 ENCOUNTER — Other Ambulatory Visit: Payer: Self-pay

## 2021-02-19 ENCOUNTER — Other Ambulatory Visit (HOSPITAL_COMMUNITY): Payer: Self-pay

## 2021-02-25 ENCOUNTER — Other Ambulatory Visit: Payer: Self-pay

## 2021-02-25 ENCOUNTER — Ambulatory Visit
Admission: RE | Admit: 2021-02-25 | Discharge: 2021-02-25 | Disposition: A | Discharge status: Home / Self Care | Payer: No Typology Code available for payment source | Source: Ambulatory Visit | Attending: Internal Medicine | Admitting: Internal Medicine

## 2021-02-25 DIAGNOSIS — R748 Abnormal levels of other serum enzymes: Secondary | ICD-10-CM | POA: Insufficient documentation

## 2021-03-03 ENCOUNTER — Other Ambulatory Visit (HOSPITAL_COMMUNITY): Payer: Self-pay

## 2021-03-05 ENCOUNTER — Other Ambulatory Visit (HOSPITAL_COMMUNITY): Payer: Self-pay

## 2021-03-05 MED FILL — Dupilumab Subcutaneous Soln Prefilled Syringe 300 MG/2ML: SUBCUTANEOUS | 28 days supply | Qty: 4 | Fill #2 | Status: AC

## 2021-04-02 ENCOUNTER — Other Ambulatory Visit (HOSPITAL_COMMUNITY): Payer: Self-pay

## 2021-04-02 MED FILL — Dupilumab Subcutaneous Soln Prefilled Syringe 300 MG/2ML: SUBCUTANEOUS | 28 days supply | Qty: 4 | Fill #3 | Status: AC

## 2021-05-01 ENCOUNTER — Other Ambulatory Visit (HOSPITAL_COMMUNITY): Payer: Self-pay

## 2021-05-01 MED FILL — Dupilumab Subcutaneous Soln Prefilled Syringe 300 MG/2ML: SUBCUTANEOUS | 28 days supply | Qty: 4 | Fill #4 | Status: AC

## 2021-05-08 ENCOUNTER — Other Ambulatory Visit: Payer: Self-pay

## 2021-05-08 ENCOUNTER — Other Ambulatory Visit: Payer: Self-pay | Admitting: Internal Medicine

## 2021-05-08 MED ORDER — AMPHETAMINE-DEXTROAMPHET ER 10 MG PO CP24
10.0000 mg | ORAL_CAPSULE | Freq: Every day | ORAL | 0 refills | Status: DC
  Filled 2021-05-08: qty 30, 30d supply, fill #0

## 2021-05-08 MED ORDER — AMPHETAMINE-DEXTROAMPHETAMINE 10 MG PO TABS
ORAL_TABLET | ORAL | 0 refills | Status: DC
  Filled 2021-05-08: qty 30, 30d supply, fill #0

## 2021-05-08 MED ORDER — CARESTART COVID-19 HOME TEST VI KIT
PACK | 0 refills | Status: DC
  Filled 2021-05-08: qty 2, 4d supply, fill #0

## 2021-05-08 NOTE — Telephone Encounter (Signed)
RX Refill:adderall Last Seen:12-31-20 Last ordered:02-10-21

## 2021-05-23 ENCOUNTER — Other Ambulatory Visit (HOSPITAL_BASED_OUTPATIENT_CLINIC_OR_DEPARTMENT_OTHER): Payer: Self-pay

## 2021-05-28 ENCOUNTER — Other Ambulatory Visit (HOSPITAL_COMMUNITY): Payer: Self-pay

## 2021-05-28 MED FILL — Dupilumab Subcutaneous Soln Prefilled Syringe 300 MG/2ML: SUBCUTANEOUS | 28 days supply | Qty: 4 | Fill #5 | Status: AC

## 2021-06-23 ENCOUNTER — Other Ambulatory Visit (HOSPITAL_COMMUNITY): Payer: Self-pay

## 2021-06-23 MED FILL — Dupilumab Subcutaneous Soln Prefilled Syringe 300 MG/2ML: SUBCUTANEOUS | 28 days supply | Qty: 4 | Fill #6 | Status: CN

## 2021-06-25 ENCOUNTER — Other Ambulatory Visit (HOSPITAL_COMMUNITY): Payer: Self-pay

## 2021-06-26 ENCOUNTER — Other Ambulatory Visit (HOSPITAL_COMMUNITY): Payer: Self-pay

## 2021-07-01 ENCOUNTER — Other Ambulatory Visit (HOSPITAL_COMMUNITY): Payer: Self-pay

## 2021-07-01 MED FILL — Dupilumab Subcutaneous Soln Prefilled Syringe 300 MG/2ML: SUBCUTANEOUS | 28 days supply | Qty: 4 | Fill #6 | Status: AC

## 2021-07-28 ENCOUNTER — Other Ambulatory Visit (HOSPITAL_COMMUNITY): Payer: Self-pay

## 2021-07-28 MED FILL — Dupilumab Subcutaneous Soln Prefilled Syringe 300 MG/2ML: SUBCUTANEOUS | 28 days supply | Qty: 4 | Fill #7 | Status: AC

## 2021-08-20 ENCOUNTER — Other Ambulatory Visit (HOSPITAL_COMMUNITY): Payer: Self-pay

## 2021-08-20 MED FILL — Dupilumab Subcutaneous Soln Prefilled Syringe 300 MG/2ML: SUBCUTANEOUS | 28 days supply | Qty: 4 | Fill #8 | Status: AC

## 2021-08-28 ENCOUNTER — Other Ambulatory Visit: Payer: Self-pay

## 2021-08-28 MED ORDER — SODIUM FLUORIDE 1.1 % DT PSTE
PASTE | DENTAL | 3 refills | Status: AC
  Filled 2021-08-28: qty 100, 30d supply, fill #0
  Filled 2022-08-21: qty 100, 30d supply, fill #1

## 2021-09-09 ENCOUNTER — Other Ambulatory Visit (HOSPITAL_COMMUNITY): Payer: Self-pay

## 2021-09-09 MED FILL — Dupilumab Subcutaneous Soln Prefilled Syringe 300 MG/2ML: SUBCUTANEOUS | 28 days supply | Qty: 4 | Fill #9 | Status: AC

## 2021-09-11 ENCOUNTER — Other Ambulatory Visit (HOSPITAL_COMMUNITY): Payer: Self-pay

## 2021-09-18 ENCOUNTER — Other Ambulatory Visit (HOSPITAL_COMMUNITY): Payer: Self-pay

## 2021-10-14 ENCOUNTER — Other Ambulatory Visit (HOSPITAL_COMMUNITY): Payer: Self-pay

## 2021-10-14 MED FILL — Dupilumab Subcutaneous Soln Prefilled Syringe 300 MG/2ML: SUBCUTANEOUS | 28 days supply | Qty: 4 | Fill #10 | Status: AC

## 2021-11-07 ENCOUNTER — Other Ambulatory Visit (HOSPITAL_COMMUNITY): Payer: Self-pay

## 2021-11-10 ENCOUNTER — Other Ambulatory Visit (HOSPITAL_COMMUNITY): Payer: Self-pay

## 2021-11-11 ENCOUNTER — Other Ambulatory Visit: Payer: Self-pay

## 2021-11-11 MED ORDER — CLOBETASOL PROPIONATE 0.05 % EX OINT
TOPICAL_OINTMENT | CUTANEOUS | 1 refills | Status: DC
  Filled 2021-11-11: qty 45, 30d supply, fill #0
  Filled 2021-11-12: qty 45, 20d supply, fill #0
  Filled 2022-05-30: qty 45, 20d supply, fill #1

## 2021-11-12 ENCOUNTER — Other Ambulatory Visit: Payer: Self-pay

## 2021-11-13 ENCOUNTER — Other Ambulatory Visit (HOSPITAL_COMMUNITY): Payer: Self-pay

## 2021-11-13 ENCOUNTER — Other Ambulatory Visit: Payer: Self-pay

## 2021-11-13 MED ORDER — DUPILUMAB 300 MG/2ML ~~LOC~~ SOSY
PREFILLED_SYRINGE | SUBCUTANEOUS | 11 refills | Status: DC
  Filled 2021-11-13: qty 4, fill #0

## 2021-11-14 ENCOUNTER — Other Ambulatory Visit (HOSPITAL_COMMUNITY): Payer: Self-pay

## 2021-11-14 ENCOUNTER — Other Ambulatory Visit: Payer: Self-pay

## 2021-11-14 ENCOUNTER — Telehealth: Payer: Self-pay | Admitting: Pharmacist

## 2021-11-14 ENCOUNTER — Ambulatory Visit: Payer: No Typology Code available for payment source | Attending: Internal Medicine | Admitting: Pharmacist

## 2021-11-14 DIAGNOSIS — Z79899 Other long term (current) drug therapy: Secondary | ICD-10-CM

## 2021-11-14 MED ORDER — DUPIXENT 300 MG/2ML ~~LOC~~ SOSY
PREFILLED_SYRINGE | SUBCUTANEOUS | 11 refills | Status: DC
  Filled 2021-11-14: qty 4, 28d supply, fill #0
  Filled 2021-12-04: qty 4, 28d supply, fill #1
  Filled 2022-01-02: qty 4, 28d supply, fill #2
  Filled 2022-01-27: qty 4, 28d supply, fill #3
  Filled 2022-02-23: qty 4, 28d supply, fill #4

## 2021-11-14 NOTE — Telephone Encounter (Signed)
Called patient to schedule an appointment for the [facility]. I was unable to reach the patient so I left a HIPAA-compliant message requesting that the patient return my call.   Luke Van Ausdall, PharmD, BCACP, CPP Clinical Pharmacist [facility] [phone]  

## 2021-11-14 NOTE — Progress Notes (Signed)
° °  S: Patient presents for review of their specialty medication therapy.  Patient is currently taking Dupixent for atopic dermatitis. Patient is managed by [facility] for this.   Adherence: denies any missed doses  Efficacy: reports that it continues to work well for him and denies any adverse effects  Dosing: 300 mg every 2 weeks  Monitoring: S/sx of infection: denies Hypersensitivity reaction: denies Ocular effects: denies S/sx of eosinophilia/vasculitis: denies  O:      Lab Results  Component Value Date   WBC 8.6 11/03/2017   HGB 14.7 11/03/2017   HCT 42.9 11/03/2017   MCV 92.0 11/03/2017   PLT 322.0 11/03/2017      Chemistry      Component Value Date/Time   NA 139 01/07/2021 1107   NA 138 04/27/2014 0741   NA 138 04/27/2014 0741   K 4.4 01/07/2021 1107   K 4.1 04/27/2014 0741   CL 105 01/07/2021 1107   CL 104 04/27/2014 0741   CO2 27 01/07/2021 1107   CO2 26 04/27/2014 0741   BUN 13 01/07/2021 1107   BUN 19 04/27/2014 0741   BUN 19 (H) 04/27/2014 0741   CREATININE 0.91 01/07/2021 1107   CREATININE 1.12 04/27/2014 0741   GLU 102 04/27/2014 0741      Component Value Date/Time   CALCIUM 9.4 01/07/2021 1107   CALCIUM 8.7 04/27/2014 0741   ALKPHOS 77 01/07/2021 1107   ALKPHOS 78 04/27/2014 0741   AST 39 (H) 01/07/2021 1107   AST 25 04/27/2014 0741   ALT 62 (H) 01/07/2021 1107   ALT 40 04/27/2014 0741   BILITOT 0.5 01/07/2021 1107   BILITOT 0.6 04/27/2014 0741       A/P: 1. Medication review: Patient currently on [place] for atopic dermatitis and is tolerating it well with no adverse effects and good control of his atopic dermatitis. Reviewed medication with patient, including the following: Dupixent is a monoclonal antibody used for atopic dermatitis and asthma. Possible adverse effects include increased risk of infection, ocular effects, injection site reactions, and eosinophilia/vasculitis. No recommendations for any changes.  Benard Halsted,  PharmD, [facility], [place] [phone]

## 2021-11-17 ENCOUNTER — Other Ambulatory Visit (HOSPITAL_COMMUNITY): Payer: Self-pay

## 2021-11-18 ENCOUNTER — Other Ambulatory Visit: Payer: Self-pay

## 2021-11-18 ENCOUNTER — Other Ambulatory Visit (INDEPENDENT_AMBULATORY_CARE_PROVIDER_SITE_OTHER): Payer: Self-pay | Admitting: Vascular Surgery

## 2021-11-18 DIAGNOSIS — J301 Allergic rhinitis due to pollen: Secondary | ICD-10-CM

## 2021-11-18 MED ORDER — FLUTICASONE PROPIONATE 50 MCG/ACT NA SUSP
2.0000 | Freq: Every day | NASAL | 4 refills | Status: DC
  Filled 2021-11-18: qty 16, 30d supply, fill #0

## 2021-11-18 NOTE — Progress Notes (Signed)
[place]

## 2021-11-21 ENCOUNTER — Encounter: Payer: Self-pay | Admitting: Internal Medicine

## 2021-11-21 ENCOUNTER — Other Ambulatory Visit: Payer: Self-pay

## 2021-11-21 ENCOUNTER — Ambulatory Visit (INDEPENDENT_AMBULATORY_CARE_PROVIDER_SITE_OTHER): Payer: No Typology Code available for payment source | Admitting: Internal Medicine

## 2021-11-21 VITALS — BP 122/80 | HR 100 | Temp 98.7°F | Ht 66.0 in | Wt 221.4 lb

## 2021-11-21 DIAGNOSIS — L309 Dermatitis, unspecified: Secondary | ICD-10-CM | POA: Diagnosis not present

## 2021-11-21 DIAGNOSIS — I10 Essential (primary) hypertension: Secondary | ICD-10-CM | POA: Diagnosis not present

## 2021-11-21 DIAGNOSIS — E669 Obesity, unspecified: Secondary | ICD-10-CM

## 2021-11-21 DIAGNOSIS — F411 Generalized anxiety disorder: Secondary | ICD-10-CM

## 2021-11-21 DIAGNOSIS — L400 Psoriasis vulgaris: Secondary | ICD-10-CM

## 2021-11-21 DIAGNOSIS — E782 Mixed hyperlipidemia: Secondary | ICD-10-CM | POA: Diagnosis not present

## 2021-11-21 DIAGNOSIS — K760 Fatty (change of) liver, not elsewhere classified: Secondary | ICD-10-CM

## 2021-11-21 DIAGNOSIS — F900 Attention-deficit hyperactivity disorder, predominantly inattentive type: Secondary | ICD-10-CM

## 2021-11-21 MED ORDER — AMPHETAMINE-DEXTROAMPHET ER 10 MG PO CP24
10.0000 mg | ORAL_CAPSULE | Freq: Every day | ORAL | 0 refills | Status: DC
  Filled 2021-11-21: qty 30, 30d supply, fill #0

## 2021-11-21 MED ORDER — AMPHETAMINE-DEXTROAMPHETAMINE 10 MG PO TABS
ORAL_TABLET | ORAL | 0 refills | Status: DC
  Filled 2021-11-21: qty 30, 30d supply, fill #0

## 2021-11-21 NOTE — Progress Notes (Signed)
Subjective:  Patient ID: Christopher Terrell, male    DOB: [birth year]  Age: 36 y.o. MRN: [id number]  CC: The primary encounter diagnosis was Primary hypertension. Diagnoses of Eczema of hand, Plaque psoriasis, Elevated triglycerides with high cholesterol, Attention deficit hyperactivity disorder (ADHD), predominantly inattentive type, Generalized anxiety disorder, Obesity (BMI 30.0-34.9), and Hepatic steatosis were also pertinent to this visit.   This visit occurred during the SARS-CoV-2 public health emergency.  Safety protocols were in place, including screening questions prior to the visit, additional usage of staff PPE, and extensive cleaning of exam room while observing appropriate contact time as indicated for disinfecting solutions.    HPI Christopher Terrell presents for follow up on multiple issues  Chief Complaint  Patient presents with   Medication Refill   referral to rheumatology   1) e has been diagnosed by dermatology with Placque psoriasis by skin biopsy of a lesion on his lower leg  he has been prescribed temovate prn and advised to see Rheumatology.  The diagnosis may be  linked  to dupixent use for management of eczema. . Rheumatology referral mae  2) ADD:  using Adderall  with stable effects on concentration.  He reports no side effects.    3) Anxiety:  his mother Christopher Terrell has given up her battle with lung CA after a recent hospitalization for pneumonia and was discharged to hospice home for management of dyspnea and is currently receiving palliative care including morphine .   4) Obesity: he has had an  intentional. Loss of  11 lb since last visit.  Exercising ,  restricting diet.   50 Hypertension: patient checks blood pressure twice weekly at home.  Readings have been for the most part < 140/80 at rest . Patient is following a reduceD salt diet most days and is taking medications as prescribed    Outpatient Medications Prior to Visit  Medication Sig Dispense Refill    amLODipine (NORVASC) 2.5 MG tablet Take 1 tablet (2.5 mg total) by mouth daily. 90 tablet 3   clobetasol ointment (TEMOVATE) 0.05 % Apply to affected areas twice daily until smooth, then stop and use as needed. 45 g 1   clonazePAM (KLONOPIN) 0.25 MG disintegrating tablet Take 1 tablet (0.25 mg total) by mouth daily as needed. 30 tablet 0   COVID-19 At Home Antigen Test (CARESTART COVID-19 HOME TEST) KIT USE AS DIRECTED WITHIN PACKAGE INSTRUCTIONS 2 kit 0   dupilumab (DUPIXENT) 300 MG/2ML prefilled syringe INJECT 300 MG (1 SYRINGE) UNDER THE SKIN EVERY 2 WEEKS STARTING ON DAY 15 AS DIRECTED. 4 mL 11   fluticasone (FLONASE) 50 MCG/ACT nasal spray Place 2 sprays into both nostrils daily. 16 g 4   metoprolol succinate (TOPROL-XL) 50 MG 24 hr tablet Take 1 tablet (50 mg total) by mouth daily. Take with or immediately following a meal. 90 tablet 3   montelukast (SINGULAIR) 10 MG tablet Take 1 tablet (10 mg total) by mouth at bedtime. 90 tablet 1   Sodium Fluoride 1.1 % PSTE Brush with pea sized amount for 2 minutes twice a day. Spit, do not swallow. Do not eat, drink or rinse mouth for 30 mins 100 mL 3   amphetamine-dextroamphetamine (ADDERALL XR) 10 MG 24 hr capsule Take 1 capsule (10 mg total) by mouth daily. 30 capsule 0   amphetamine-dextroamphetamine (ADDERALL) 10 MG tablet TAKE 1 TABLET BY MOUTH IN THE AFTERNOON 30 tablet 0   triamcinolone ointment (KENALOG) 0.1 %   0   No  facility-administered medications prior to visit.    Review of Systems;  Patient denies headache, fevers, malaise, unintentional weight loss, skin rash, eye pain, sinus congestion and sinus pain, sore throat, dysphagia,  hemoptysis , cough, dyspnea, wheezing, chest pain, palpitations, orthopnea, edema, abdominal pain, nausea, melena, diarrhea, constipation, flank pain, dysuria, hematuria, urinary  Frequency, nocturia, numbness, tingling, seizures,  Focal weakness, Loss of consciousness,  Tremor, insomnia,  and suicidal  ideation.      Objective:  BP 122/80 (BP Location: Left Arm, Patient Position: Sitting, Cuff Size: Large)    Pulse 100    Temp 98.7 F (37.1 C) (Oral)    Ht _0  (1.676 m)    Wt 221 lb 6.4 oz (100.4 kg)    SpO2 97%    BMI 35.73 kg/m   BP Readings from Last 3 Encounters:  11/21/21 122/80  12/31/20 130/90  04/30/20 120/78    Wt Readings from Last 3 Encounters:  11/21/21 221 lb 6.4 oz (100.4 kg)  12/31/20 232 lb 12.8 oz (105.6 kg)  04/30/20 210 lb (95.3 kg)    General appearance: alert, cooperative and appears stated age Ears: normal TM's and external ear canals both ears Throat: lips, mucosa, and tongue normal; teeth and gums normal Neck: no adenopathy, no carotid bruit, supple, symmetrical, trachea midline and thyroid not enlarged, symmetric, no tenderness/mass/nodules Back: symmetric, no curvature. ROM normal. No CVA tenderness. Lungs: clear to auscultation bilaterally Heart: regular rate and rhythm, S1, S2 normal, no murmur, click, rub or gallop Abdomen: soft, non-tender; bowel sounds normal; no masses,  no organomegaly Pulses: 2+ and symmetric Skin: LEFT LOWER LEG WITH ERYTHEMATOUS MACULAR LESION .  Lymph nodes: Cervical, supraclavicular, and axillary nodes normal. Psych: affect normal, makes good eye contact. No fidgeting,  Smiles easily.  Denies suicidal thoughts    Lab Results  Component Value Date   HGBA1C 5.5 01/07/2021    Lab Results  Component Value Date   CREATININE 0.91 01/07/2021   CREATININE 1.05 06/10/2020   CREATININE 0.89 03/16/2019    Lab Results  Component Value Date   WBC 8.6 11/03/2017   HGB 14.7 11/03/2017   HCT 42.9 11/03/2017   PLT 322.0 11/03/2017   GLUCOSE 87 01/07/2021   CHOL 178 01/07/2021   TRIG 132.0 01/07/2021   HDL 32.00 (L) 01/07/2021   LDLDIRECT 117.0 03/16/2016   LDLCALC 120 (H) 01/07/2021   ALT 62 (H) 01/07/2021   AST 39 (H) 01/07/2021   NA 139 01/07/2021   K 4.4 01/07/2021   CL 105 01/07/2021   CREATININE 0.91  01/07/2021   BUN 13 01/07/2021   CO2 27 01/07/2021   TSH 1.92 01/07/2021   HGBA1C 5.5 01/07/2021   MICROALBUR 1.2 01/07/2021    US Abdomen Limited RUQ (LIVER/GB)  Result Date: 02/25/2021 CLINICAL DATA:  Elevated liver enzymes. EXAM: ULTRASOUND ABDOMEN LIMITED RIGHT UPPER QUADRANT COMPARISON:  None. FINDINGS: Gallbladder: No gallstones or wall thickening visualized (2.8 mm). No sonographic Murphy sign noted by sonographer. Common bile duct: Diameter: 3.6 mm Liver: No focal lesion identified. Diffusely increased echogenicity of the liver parenchyma is noted. Portal vein is patent on color Doppler imaging with normal direction of blood flow towards the liver. Other: None. IMPRESSION: Fatty liver. Electronically Signed   By: Virgina Norfolk M.D.   On: 02/25/2021 22:56    Assessment & Plan:   Problem List Items Addressed This Visit     Attention deficit hyperactivity disorder (ADHD), predominantly inattentive type    Currently untreated due to being  lost to follow up.  Will resume medication at a lower dose of Adderall XR 10 mg daily.  With 10  mg of short acting adderall as needed for weekend or evening work  To help concentration      Eczema of hand    Managed with dupixent/  . Now with placque psoriasis of leg . Rheumatology referral in progress.      Elevated triglycerides with high cholesterol   Relevant Orders   Lipid Panel w/reflex Direct LDL   Generalized anxiety disorder    Well controlled on current regimen. , no changes today. Clonazepam for use prn and refilled .  The risks and benefits of benzodiazepine use were reviewed with patient today including excessive sedation leading to respiratory depression,  impaired thinking/driving,  Addiction and dementia   Patient was advised to avoid concurrent use with alcohol, to use medication only as needed and not to share with others  .        Hepatic steatosis    sugegsted  by ultrasound changes and serologies negative for autoimmune  causes of hepatitis. all modifiable risk factors including obesity, diabetes and hyperlipidemia have been addressed .  He is overdue for surveillance   Lab Results  Component Value Date   ALT 62 (H) 01/07/2021   AST 39 (H) 01/07/2021   ALKPHOS 77 01/07/2021   BILITOT 0.5 01/07/2021         Hypertension - Primary     well controlled on metoprolol 50 mg daily and  amlodipine 2.5 mg daily .       Relevant Orders   Comprehensive metabolic panel   Obesity (BMI 30.0-34.9)    I have congratulated him  in reduction of   BMI and encouraged  Continued weight loss with goal of 10% of body weigh over the next 6 months using a low glycemic index diet and regular exercise a minimum of 5 days per week.        Plaque psoriasis    Possibly due to use of dupixent  Per dermatologist Dr Evorn Gong       Relevant Orders   Ambulatory referral to Rheumatology   CBC with Differential/Platelet    I spent 30 minutes dedicated to the care of this patient on the date of this encounter to include pre-visit review of patient's medical history,  most recent imaging studies, Face-to-face time with the patient , and post visit ordering of testing and therapeutics.    Follow-up: No follow-ups on file.   Crecencio Mc, MD

## 2021-11-21 NOTE — Assessment & Plan Note (Addendum)
Managed with dupixent/  . Now with placque psoriasis of leg . Rheumatology referral in progress. ?

## 2021-11-21 NOTE — Assessment & Plan Note (Signed)
Possibly due to use of dupixent  Per dermatologist Dr Evorn Gong  ?

## 2021-11-21 NOTE — Patient Instructions (Addendum)
Your annual physical was April 12 last year ? ?Fasting labs prior to this year's.  DRINK WATER BEFORE YOU COME,  AT LEAST 16 OUNCES   ?

## 2021-11-23 NOTE — Assessment & Plan Note (Signed)
Currently untreated due to being lost to follow up.  Will resume medication at a lower dose of Adderall XR 10 mg daily.  With 10  mg of short acting adderall as needed for weekend or evening work  To help concentration ?

## 2021-11-23 NOTE — Assessment & Plan Note (Signed)
I have congratulated him in reduction of   BMI and encouraged  Continued weight loss with goal of 10% of body weigh over the next 6 months using a low glycemic index diet and regular exercise a minimum of 5 days per week.   

## 2021-11-23 NOTE — Assessment & Plan Note (Signed)
Well controlled on current regimen. , no changes today. Clonazepam for use prn and refilled .  The risks and benefits of benzodiazepine use were reviewed with patient today including excessive sedation leading to respiratory depression,  impaired thinking/driving,  Addiction and dementia   Patient was advised to avoid concurrent use with alcohol, to use medication only as needed and not to share with others  .  ? ?

## 2021-11-23 NOTE — Assessment & Plan Note (Signed)
sugegsted  by ultrasound changes and serologies negative for autoimmune causes of hepatitis. all modifiable risk factors including obesity, diabetes and hyperlipidemia have been addressed .  He is overdue for surveillance  ? ?Lab Results  ?Component Value Date  ? ALT 62 (H) 01/07/2021  ? AST 39 (H) 01/07/2021  ? ALKPHOS 77 01/07/2021  ? BILITOT 0.5 01/07/2021  ? ? ?

## 2021-11-23 NOTE — Assessment & Plan Note (Addendum)
well controlled on metoprolol 50 mg daily and  amlodipine 2.5 mg daily .  ?

## 2021-12-04 ENCOUNTER — Other Ambulatory Visit (HOSPITAL_COMMUNITY): Payer: Self-pay

## 2021-12-10 ENCOUNTER — Other Ambulatory Visit (HOSPITAL_COMMUNITY): Payer: Self-pay

## 2022-01-02 ENCOUNTER — Other Ambulatory Visit (HOSPITAL_COMMUNITY): Payer: Self-pay

## 2022-01-05 ENCOUNTER — Other Ambulatory Visit (HOSPITAL_COMMUNITY): Payer: Self-pay

## 2022-01-12 ENCOUNTER — Encounter: Payer: No Typology Code available for payment source | Admitting: Internal Medicine

## 2022-01-27 ENCOUNTER — Other Ambulatory Visit (HOSPITAL_COMMUNITY): Payer: Self-pay

## 2022-02-05 ENCOUNTER — Other Ambulatory Visit (HOSPITAL_COMMUNITY): Payer: Self-pay

## 2022-02-13 IMAGING — US US ABDOMEN LIMITED
1 series · 14 of 25 positions shown · non-contrast
Comparison: None.

CLINICAL DATA: Elevated liver enzymes.

EXAM:
ULTRASOUND ABDOMEN LIMITED RIGHT UPPER QUADRANT

[Series 1: us abdomen limited · 0.26mm/px · 14 of 54 slices shown]
[im 1/54]
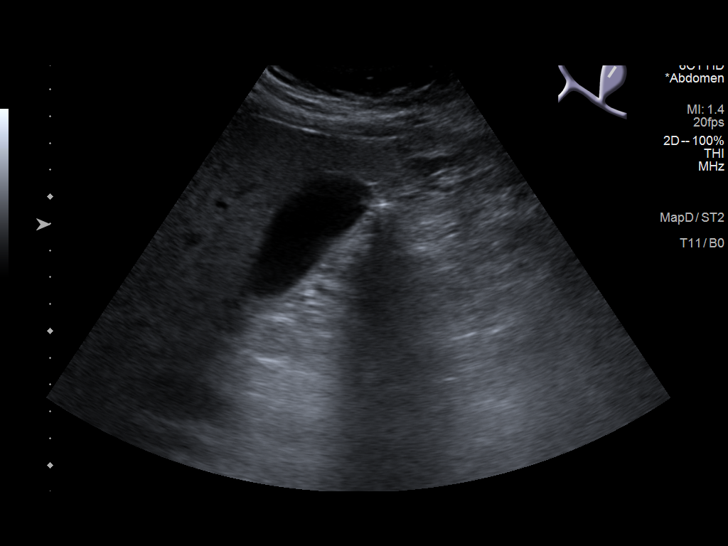
[im 5/54]
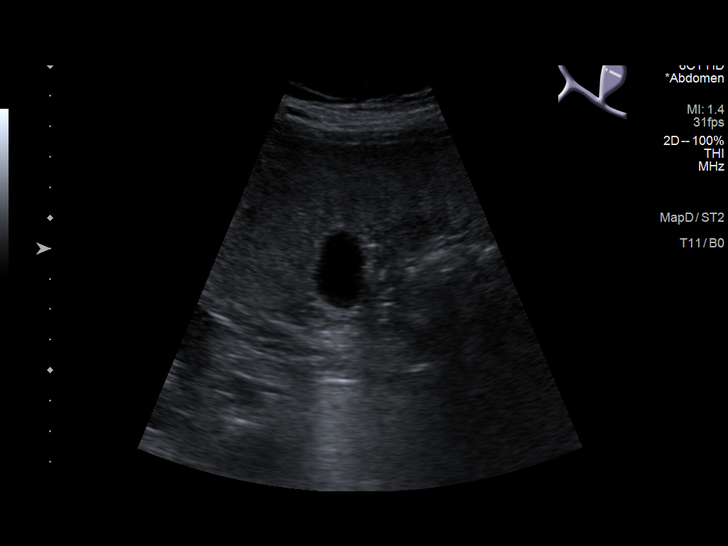
[im 9/54]
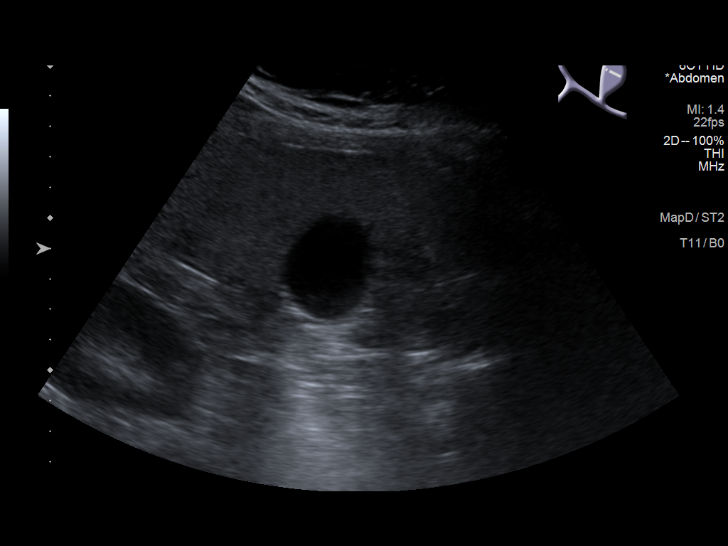
[im 14/54]
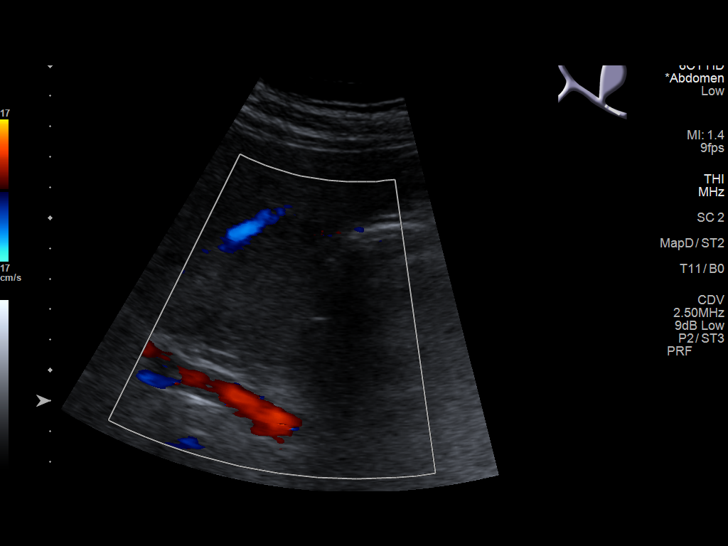
[im 18/54]
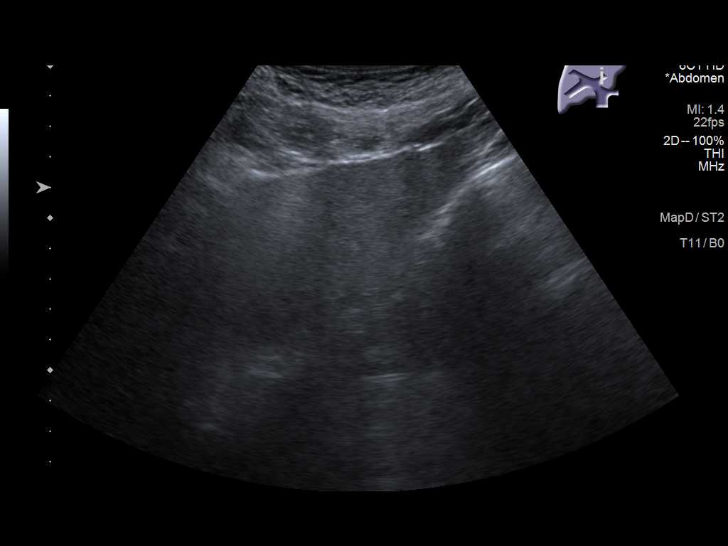
[im 20/54]
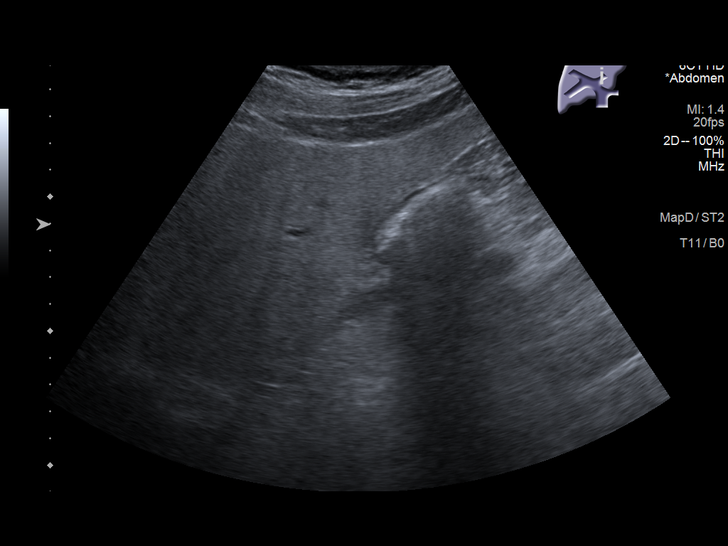
[im 25/54]
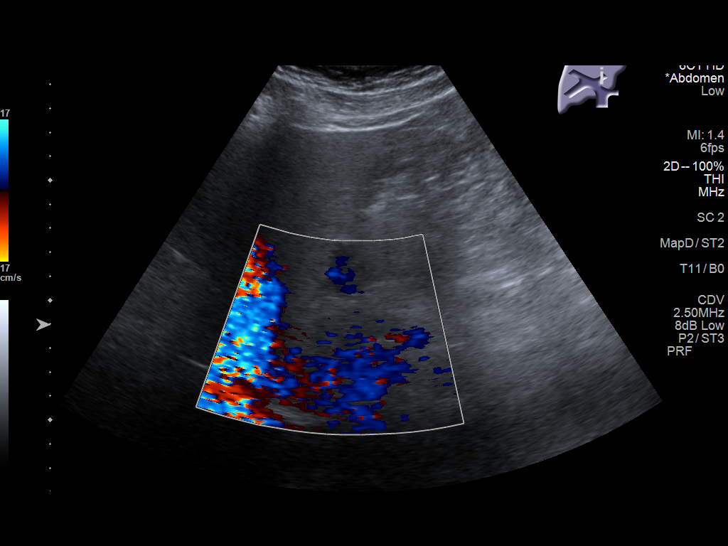
[im 29/54]
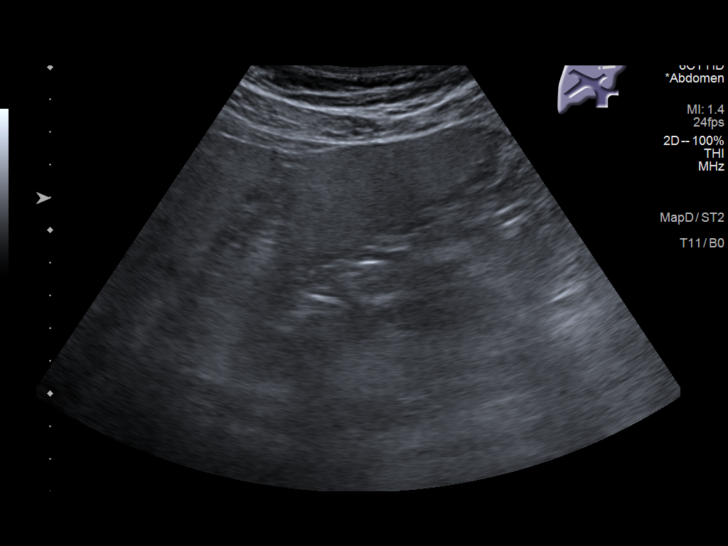
[im 34/54]
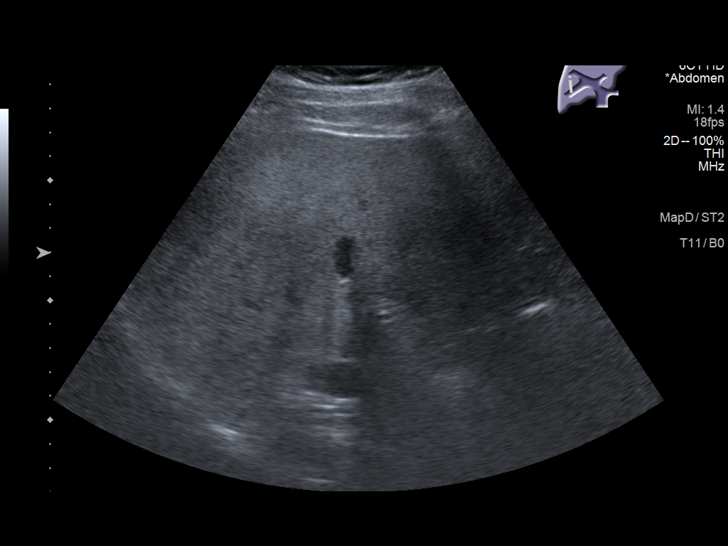
[im 36/54]
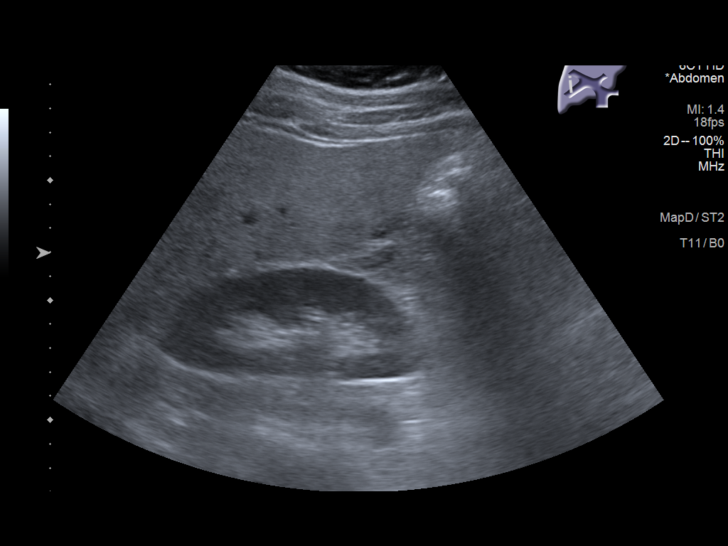
[im 40/54]
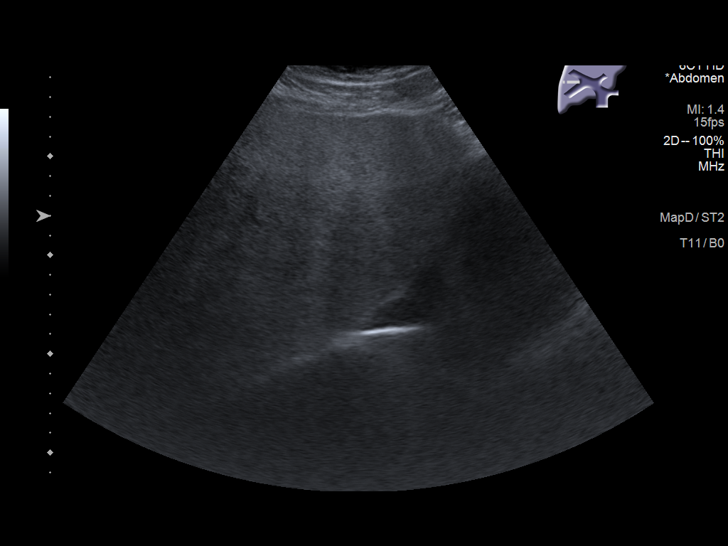
[im 45/54]
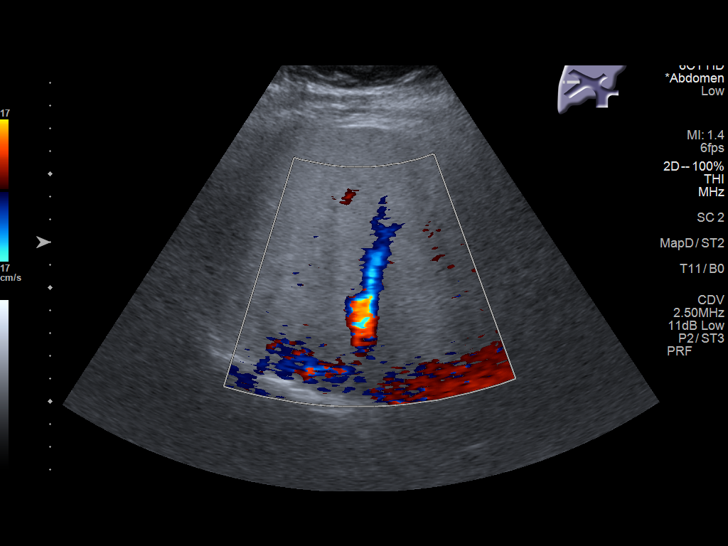
[im 49/54]
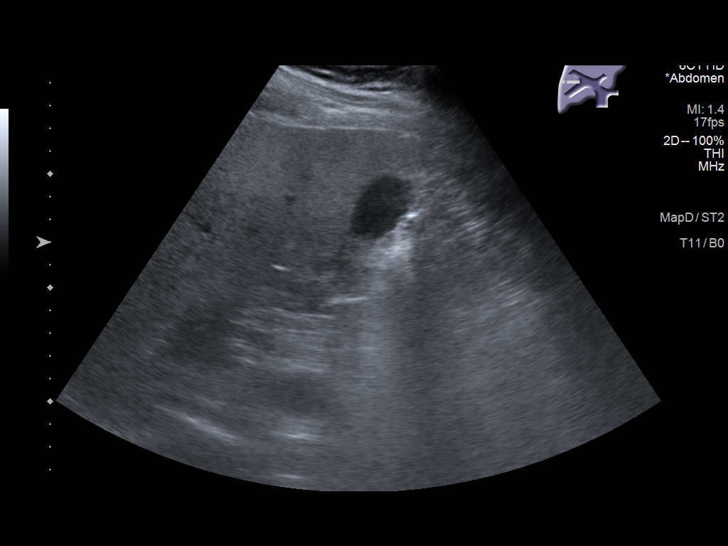
[im 54/54]
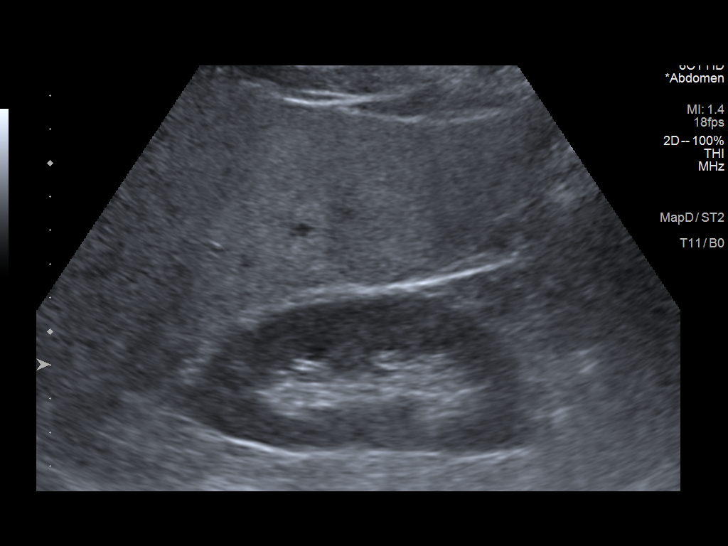

[14 of 25 positions shown; findings below may reference images not displayed]

FINDINGS: Gallbladder:

No gallstones or wall thickening visualized (2.8 mm). No sonographic
Murphy sign noted by sonographer.

Common bile duct:

Diameter: 3.6 mm

Liver:

No focal lesion identified. Diffusely increased echogenicity of the
liver parenchyma is noted. Portal vein is patent on color Doppler
imaging with normal direction of blood flow towards the liver.

Other: None.
IMPRESSION: Fatty liver.

## 2022-02-23 ENCOUNTER — Other Ambulatory Visit (HOSPITAL_COMMUNITY): Payer: Self-pay

## 2022-03-04 ENCOUNTER — Other Ambulatory Visit (HOSPITAL_COMMUNITY): Payer: Self-pay

## 2022-03-04 ENCOUNTER — Other Ambulatory Visit: Payer: Self-pay | Admitting: Internal Medicine

## 2022-03-04 ENCOUNTER — Other Ambulatory Visit: Payer: Self-pay

## 2022-03-04 MED ORDER — AMLODIPINE BESYLATE 2.5 MG PO TABS
2.5000 mg | ORAL_TABLET | Freq: Every day | ORAL | 3 refills | Status: DC
  Filled 2022-03-04: qty 90, 90d supply, fill #0
  Filled 2022-05-30: qty 90, 90d supply, fill #1
  Filled 2022-08-21: qty 90, 90d supply, fill #2

## 2022-03-04 MED ORDER — AMPHETAMINE-DEXTROAMPHETAMINE 10 MG PO TABS
ORAL_TABLET | ORAL | 0 refills | Status: DC
  Filled 2022-03-04: qty 30, 30d supply, fill #0

## 2022-03-04 MED ORDER — AMPHETAMINE-DEXTROAMPHET ER 10 MG PO CP24
10.0000 mg | ORAL_CAPSULE | Freq: Every day | ORAL | 0 refills | Status: DC
  Filled 2022-03-04: qty 30, 30d supply, fill #0

## 2022-03-04 MED ORDER — METOPROLOL SUCCINATE ER 50 MG PO TB24
50.0000 mg | ORAL_TABLET | Freq: Every day | ORAL | 3 refills | Status: DC
  Filled 2022-03-04: qty 90, 90d supply, fill #0
  Filled 2022-05-30: qty 90, 90d supply, fill #1
  Filled 2022-08-21: qty 90, 90d supply, fill #2
  Filled 2023-01-01: qty 90, 90d supply, fill #3

## 2022-03-04 MED ORDER — CLONAZEPAM 0.25 MG PO TBDP
0.2500 mg | ORAL_TABLET | Freq: Every day | ORAL | 0 refills | Status: DC | PRN
  Filled 2022-03-04: qty 30, 30d supply, fill #0

## 2022-03-04 MED ORDER — MONTELUKAST SODIUM 10 MG PO TABS
10.0000 mg | ORAL_TABLET | Freq: Every day | ORAL | 1 refills | Status: DC
  Filled 2022-03-04: qty 90, 90d supply, fill #0

## 2022-03-05 ENCOUNTER — Other Ambulatory Visit: Payer: Self-pay

## 2022-03-06 ENCOUNTER — Other Ambulatory Visit: Payer: Self-pay

## 2022-03-19 ENCOUNTER — Other Ambulatory Visit: Payer: Self-pay

## 2022-03-19 ENCOUNTER — Ambulatory Visit (INDEPENDENT_AMBULATORY_CARE_PROVIDER_SITE_OTHER): Payer: No Typology Code available for payment source | Admitting: Internal Medicine

## 2022-03-19 ENCOUNTER — Encounter: Payer: Self-pay | Admitting: Internal Medicine

## 2022-03-19 VITALS — BP 132/80 | HR 112 | Temp 98.2°F | Ht 66.0 in | Wt 219.2 lb

## 2022-03-19 DIAGNOSIS — Z113 Encounter for screening for infections with a predominantly sexual mode of transmission: Secondary | ICD-10-CM

## 2022-03-19 DIAGNOSIS — R7301 Impaired fasting glucose: Secondary | ICD-10-CM

## 2022-03-19 DIAGNOSIS — I10 Essential (primary) hypertension: Secondary | ICD-10-CM | POA: Diagnosis not present

## 2022-03-19 DIAGNOSIS — E782 Mixed hyperlipidemia: Secondary | ICD-10-CM | POA: Diagnosis not present

## 2022-03-19 DIAGNOSIS — F900 Attention-deficit hyperactivity disorder, predominantly inattentive type: Secondary | ICD-10-CM

## 2022-03-19 DIAGNOSIS — Z0000 Encounter for general adult medical examination without abnormal findings: Secondary | ICD-10-CM | POA: Diagnosis not present

## 2022-03-19 DIAGNOSIS — Z79899 Other long term (current) drug therapy: Secondary | ICD-10-CM

## 2022-03-19 MED ORDER — AMPHETAMINE-DEXTROAMPHET ER 10 MG PO CP24
10.0000 mg | ORAL_CAPSULE | Freq: Every day | ORAL | 0 refills | Status: DC
  Filled 2022-03-19 – 2022-04-20 (×2): qty 30, 30d supply, fill #0

## 2022-03-19 MED ORDER — TRAZODONE HCL 50 MG PO TABS
50.0000 mg | ORAL_TABLET | Freq: Every day | ORAL | 1 refills | Status: DC
  Filled 2022-03-19: qty 90, 90d supply, fill #0
  Filled 2022-08-31: qty 90, 90d supply, fill #1

## 2022-03-19 NOTE — Patient Instructions (Signed)
Let me know if you get "stuck " in your grief so I can help  Return for fasting labs

## 2022-03-19 NOTE — Assessment & Plan Note (Signed)
Managed with Adderall XR 10 mg daily.  With 10  mg of short acting adderall as needed for weekend or evening work  To help concentration

## 2022-03-19 NOTE — Assessment & Plan Note (Signed)
well controlled on metoprolol 50 mg daily and  amlodipine 2.5 mg daily . No changes today

## 2022-03-19 NOTE — Assessment & Plan Note (Signed)

## 2022-03-19 NOTE — Assessment & Plan Note (Signed)
Improved with diet and exercise.  Due for repeat

## 2022-03-19 NOTE — Progress Notes (Signed)
The patient is here for annual preventive examination and management of other chronic and acute problems.   The risk factors are reflected in the social history.   The roster of all physicians providing medical care to patient - is listed in the Snapshot section of the chart.   Activities of daily living:  The patient is 100% independent in all ADLs: dressing, toileting, feeding as well as independent mobility   Home safety : The patient has smoke detectors in the home. They wear seatbelts.  There are no unsecured firearms at home. There is no violence in the home.    There is no risks for hepatitis, STDs or HIV. There is no   history of blood transfusion. They have no travel history to infectious disease endemic areas of the world.   The patient has seen their dentist in the last six month. They have seen their eye doctor in the last year. The patinet  denies slight hearing difficulty with regard to whispered voices and some television programs.  They have deferred audiologic testing in the last year.  They do not  have excessive sun exposure. Discussed the need for sun protection: hats, long sleeves and use of sunscreen if there is significant sun exposure.    Diet: the importance of a healthy diet is discussed. They do have a healthy diet.   The benefits of regular aerobic exercise were discussed. The patient  exercises  3 to 5 days per week  for  60 minutes.    Depression screen: there are no signs or vegative symptoms of depression- irritability, change in appetite, anhedonia, but he is grieving the loss of his mother Fidel Levy who died in Hospice on 01/31/23 of metastatic ung CA .   The following portions of the patient's history were reviewed and updated as appropriate: allergies, current medications, past family history, past medical history,  past surgical history, past social history  and problem list.   Visual acuity was not assessed per patient preference since the patient has  regular follow up with an  ophthalmologist. Hearing and body mass index were assessed and reviewed.    During the course of the visit the patient was educated and counseled about appropriate screening and preventive services including : fall prevention , diabetes screening, nutrition counseling, colorectal cancer screening, and recommended immunizations.    Chief Complaint:  1) Grief:  mother Fidel Levy) died in [facility] 2023/01/31 from metastatic lung CA . She died 2 days before her birthday .  Having trouble sleeping , using benadryl  discussed using trazodone.   Work is overwhelming   2) Placque psoriasis on legs.  7 lesions total.  Saw Dermatology,  Dupixent  considered to be  potentional cause of it and stopped.  Using clobetasol ointment prescribed by Dasher on te lesions which are improving.     3) Allergic rhinitis: using Flonase Singulair,  benadryl and allegra  4) Obesity;  lost his momentum with regular exercise when his mother went to Hospice.  Recently renewed his efforts to resume.  Difficulty overcoming inertia discussed   Review of Symptoms  Patient denies headache, fevers, malaise, unintentional weight loss, skin rash, eye pain, sinus congestion and sinus pain, sore throat, dysphagia,  hemoptysis , cough, dyspnea, wheezing, chest pain, palpitations, orthopnea, edema, abdominal pain, nausea, melena, diarrhea, constipation, flank pain, dysuria, hematuria, urinary  Frequency, nocturia, numbness, tingling, seizures,  Focal weakness, Loss of consciousness,  Tremor, insomnia, depression, anxiety, and suicidal ideation.  Physical Exam:  BP 132/80 (BP Location: Left Arm, Patient Position: Sitting, Cuff Size: Large)   Pulse (!) 112   Temp 98.2 F (36.8 C) (Oral)   Ht _0  (1.676 m)   Wt 219 lb 3.2 oz (99.4 kg)   SpO2 97%   BMI 35.38 kg/m    General appearance: alert, cooperative and appears stated age Ears: normal TM's and external ear canals both ears Throat: lips, mucosa,  and tongue normal; teeth and gums normal Neck: no adenopathy, no carotid bruit, supple, symmetrical, trachea midline and thyroid not enlarged, symmetric, no tenderness/mass/nodules Back: symmetric, no curvature. ROM normal. No CVA tenderness. Lungs: clear to auscultation bilaterally Heart: regular rate and rhythm, S1, S2 normal, no murmur, click, rub or gallop Abdomen: soft, non-tender; bowel sounds normal; no masses,  no organomegaly Pulses: 2+ and symmetric Skin: several erythematous scaling placques , silver dollar sized,  on both lower extremities.   turgor normal. Lymph nodes: Cervical, supraclavicular, and axillary nodes normal.    Assessment and Plan:  Attention deficit hyperactivity disorder (ADHD), predominantly inattentive type  Managed with Adderall XR 10 mg daily.  With 10  mg of short acting adderall as needed for weekend or evening work  To help concentration  Elevated triglycerides with high cholesterol Improved with diet and exercise.  Due for repeat   Hypertension  well controlled on metoprolol 50 mg daily and  amlodipine 2.5 mg daily . No changes today   Encounter for preventive health examination age appropriate education and counseling updated, referrals for preventative services and immunizations addressed, dietary and smoking counseling addressed, most recent labs reviewed.  I have personally reviewed and have noted:   1) the patient's medical and social history 2) The pt's use of alcohol, tobacco, and illicit drugs 3) The patient's current medications and supplements 4) Functional ability including ADL's, fall risk, home safety risk, hearing and visual impairment 5) Diet and physical activities 6) Evidence for depression or mood disorder 7) The patient's height, weight, and BMI have been recorded in the chart  I have made referrals, and provided counseling and education based on review of the above   Updated Medication List Outpatient Encounter Medications as  of 03/19/2022  Medication Sig   amLODipine (NORVASC) 2.5 MG tablet Take 1 tablet (2.5 mg total) by mouth daily.   amphetamine-dextroamphetamine (ADDERALL) 10 MG tablet TAKE 1 TABLET BY MOUTH IN THE AFTERNOON   clobetasol ointment (TEMOVATE) 0.05 % Apply to affected areas twice daily until smooth, then stop and use as needed.   clonazePAM (KLONOPIN) 0.25 MG disintegrating tablet Take 1 tablet (0.25 mg total) by mouth daily as needed.   COVID-19 At Home Antigen Test (CARESTART COVID-19 HOME TEST) KIT USE AS DIRECTED WITHIN PACKAGE INSTRUCTIONS   fluticasone (FLONASE) 50 MCG/ACT nasal spray Place 2 sprays into both nostrils daily.   metoprolol succinate (TOPROL-XL) 50 MG 24 hr tablet Take 1 tablet (50 mg total) by mouth daily. Take with or immediately following a meal.   montelukast (SINGULAIR) 10 MG tablet Take 1 tablet (10 mg total) by mouth at bedtime.   Sodium Fluoride 1.1 % PSTE Brush with pea sized amount for 2 minutes twice a day. Spit, do not swallow. Do not eat, drink or rinse mouth for 30 mins   traZODone (DESYREL) 50 MG tablet Take 1 tablet (50 mg total) by mouth at bedtime.   [DISCONTINUED] amphetamine-dextroamphetamine (ADDERALL XR) 10 MG 24 hr capsule Take 1 capsule (10 mg total) by mouth daily.  amphetamine-dextroamphetamine (ADDERALL XR) 10 MG 24 hr capsule Take 1 capsule (10 mg total) by mouth daily.   [DISCONTINUED] dupilumab (DUPIXENT) 300 MG/2ML prefilled syringe INJECT 300 MG (1 SYRINGE) UNDER THE SKIN EVERY 2 WEEKS STARTING ON DAY 15 AS DIRECTED. (Patient not taking: Reported on 03/19/2022)   No facility-administered encounter medications on file as of 03/19/2022.

## 2022-03-30 ENCOUNTER — Other Ambulatory Visit (HOSPITAL_COMMUNITY): Payer: Self-pay

## 2022-04-07 ENCOUNTER — Other Ambulatory Visit: Payer: No Typology Code available for payment source

## 2022-04-20 ENCOUNTER — Other Ambulatory Visit: Payer: Self-pay | Admitting: Internal Medicine

## 2022-04-20 ENCOUNTER — Other Ambulatory Visit: Payer: Self-pay

## 2022-04-21 ENCOUNTER — Other Ambulatory Visit: Payer: Self-pay

## 2022-04-21 MED FILL — Amphetamine-Dextroamphetamine Tab 10 MG: ORAL | 30 days supply | Qty: 30 | Fill #0 | Status: AC

## 2022-05-11 ENCOUNTER — Other Ambulatory Visit (HOSPITAL_COMMUNITY): Payer: Self-pay

## 2022-05-13 ENCOUNTER — Other Ambulatory Visit (HOSPITAL_COMMUNITY): Payer: Self-pay

## 2022-05-18 ENCOUNTER — Other Ambulatory Visit (HOSPITAL_COMMUNITY): Payer: Self-pay

## 2022-05-19 ENCOUNTER — Other Ambulatory Visit (HOSPITAL_COMMUNITY): Payer: Self-pay

## 2022-05-20 ENCOUNTER — Other Ambulatory Visit (HOSPITAL_COMMUNITY): Payer: Self-pay

## 2022-05-22 ENCOUNTER — Other Ambulatory Visit (HOSPITAL_COMMUNITY): Payer: Self-pay

## 2022-05-27 ENCOUNTER — Other Ambulatory Visit (HOSPITAL_COMMUNITY): Payer: Self-pay

## 2022-05-28 ENCOUNTER — Other Ambulatory Visit (HOSPITAL_COMMUNITY): Payer: Self-pay

## 2022-05-30 ENCOUNTER — Other Ambulatory Visit: Payer: Self-pay | Admitting: Internal Medicine

## 2022-05-31 ENCOUNTER — Other Ambulatory Visit: Payer: Self-pay

## 2022-05-31 MED ORDER — AMPHETAMINE-DEXTROAMPHET ER 10 MG PO CP24
10.0000 mg | ORAL_CAPSULE | Freq: Every day | ORAL | 0 refills | Status: DC
  Filled 2022-05-31 – 2022-06-02 (×3): qty 30, 30d supply, fill #0

## 2022-05-31 MED ORDER — AMPHETAMINE-DEXTROAMPHETAMINE 10 MG PO TABS
ORAL_TABLET | ORAL | 0 refills | Status: DC
  Filled 2022-05-31: qty 30, 30d supply, fill #0

## 2022-06-01 ENCOUNTER — Other Ambulatory Visit: Payer: Self-pay

## 2022-06-02 ENCOUNTER — Other Ambulatory Visit: Payer: Self-pay

## 2022-06-03 ENCOUNTER — Other Ambulatory Visit: Payer: Self-pay

## 2022-06-04 ENCOUNTER — Other Ambulatory Visit (HOSPITAL_COMMUNITY): Payer: Self-pay

## 2022-06-04 ENCOUNTER — Other Ambulatory Visit: Payer: Self-pay

## 2022-06-04 MED ORDER — DUPIXENT 300 MG/2ML ~~LOC~~ SOAJ: SUBCUTANEOUS | 11 refills | Status: DC

## 2022-06-08 ENCOUNTER — Other Ambulatory Visit: Payer: Self-pay

## 2022-06-09 ENCOUNTER — Other Ambulatory Visit: Payer: Self-pay | Admitting: Pharmacist

## 2022-06-09 ENCOUNTER — Other Ambulatory Visit (HOSPITAL_COMMUNITY): Payer: Self-pay

## 2022-06-09 MED ORDER — DUPIXENT 300 MG/2ML ~~LOC~~ SOAJ
SUBCUTANEOUS | 11 refills | Status: DC
  Filled 2022-06-09: qty 2, 28d supply, fill #0
  Filled 2022-06-30: qty 4, 28d supply, fill #1
  Filled 2022-07-28: qty 4, 28d supply, fill #2
  Filled 2022-08-31: qty 4, 28d supply, fill #3

## 2022-06-10 ENCOUNTER — Other Ambulatory Visit: Payer: Self-pay

## 2022-06-10 MED ORDER — OSELTAMIVIR PHOSPHATE 75 MG PO CAPS
75.0000 mg | ORAL_CAPSULE | Freq: Two times a day (BID) | ORAL | 0 refills | Status: DC
  Filled 2022-06-10: qty 10, 5d supply, fill #0

## 2022-06-30 ENCOUNTER — Other Ambulatory Visit (HOSPITAL_COMMUNITY): Payer: Self-pay

## 2022-07-08 ENCOUNTER — Other Ambulatory Visit (HOSPITAL_COMMUNITY): Payer: Self-pay

## 2022-07-20 ENCOUNTER — Encounter (INDEPENDENT_AMBULATORY_CARE_PROVIDER_SITE_OTHER): Payer: Self-pay

## 2022-07-28 ENCOUNTER — Other Ambulatory Visit (HOSPITAL_COMMUNITY): Payer: Self-pay

## 2022-07-29 ENCOUNTER — Other Ambulatory Visit (HOSPITAL_COMMUNITY): Payer: Self-pay

## 2022-08-05 ENCOUNTER — Other Ambulatory Visit (HOSPITAL_COMMUNITY): Payer: Self-pay

## 2022-08-11 ENCOUNTER — Other Ambulatory Visit (HOSPITAL_COMMUNITY): Payer: Self-pay

## 2022-08-21 ENCOUNTER — Other Ambulatory Visit: Payer: Self-pay

## 2022-08-21 ENCOUNTER — Other Ambulatory Visit: Payer: Self-pay | Admitting: Internal Medicine

## 2022-08-21 MED ORDER — CHLORHEXIDINE GLUCONATE 0.12 % MT SOLN
15.0000 mL | Freq: Two times a day (BID) | OROMUCOSAL | 3 refills | Status: DC
  Filled 2022-08-21: qty 473, 16d supply, fill #0
  Filled 2023-03-05: qty 473, 16d supply, fill #1

## 2022-08-23 ENCOUNTER — Other Ambulatory Visit: Payer: Self-pay | Admitting: Internal Medicine

## 2022-08-23 ENCOUNTER — Other Ambulatory Visit: Payer: Self-pay

## 2022-08-24 ENCOUNTER — Other Ambulatory Visit: Payer: Self-pay

## 2022-08-26 ENCOUNTER — Other Ambulatory Visit: Payer: Self-pay

## 2022-08-26 MED ORDER — AMPHETAMINE-DEXTROAMPHET ER 10 MG PO CP24
10.0000 mg | ORAL_CAPSULE | Freq: Every day | ORAL | 0 refills | Status: DC
  Filled 2022-08-26: qty 30, 30d supply, fill #0

## 2022-08-26 MED ORDER — AMPHETAMINE-DEXTROAMPHETAMINE 10 MG PO TABS
ORAL_TABLET | ORAL | 0 refills | Status: DC
  Filled 2022-08-26: qty 30, 30d supply, fill #0

## 2022-08-26 MED FILL — Clonazepam Orally Disintegrating Tab 0.25 MG: ORAL | 30 days supply | Qty: 30 | Fill #0 | Status: AC

## 2022-08-26 NOTE — Telephone Encounter (Signed)
These were requested on Dec 3 and just receiving them today

## 2022-08-28 ENCOUNTER — Other Ambulatory Visit: Payer: Self-pay

## 2022-08-31 ENCOUNTER — Other Ambulatory Visit (HOSPITAL_COMMUNITY): Payer: Self-pay

## 2022-08-31 ENCOUNTER — Other Ambulatory Visit: Payer: Self-pay

## 2022-09-01 ENCOUNTER — Other Ambulatory Visit (HOSPITAL_COMMUNITY): Payer: Self-pay

## 2022-09-01 ENCOUNTER — Other Ambulatory Visit: Payer: Self-pay | Admitting: Pharmacist

## 2022-09-01 MED ORDER — DUPIXENT 300 MG/2ML ~~LOC~~ SOSY: PREFILLED_SYRINGE | SUBCUTANEOUS | 11 refills | Status: DC

## 2022-09-01 MED ORDER — DUPIXENT 300 MG/2ML ~~LOC~~ SOSY
PREFILLED_SYRINGE | SUBCUTANEOUS | 11 refills | Status: DC
  Filled 2022-09-01 – 2022-09-02 (×2): qty 4, 28d supply, fill #0
  Filled 2022-09-28: qty 4, 28d supply, fill #1
  Filled 2022-10-28: qty 4, 28d supply, fill #2
  Filled 2022-11-19: qty 4, 28d supply, fill #3
  Filled 2022-12-16: qty 4, 28d supply, fill #4
  Filled 2023-01-19: qty 4, 28d supply, fill #5
  Filled 2023-02-12: qty 4, 28d supply, fill #6
  Filled 2023-03-19: qty 4, 28d supply, fill #7
  Filled 2023-05-05: qty 4, 28d supply, fill #8
  Filled 2023-06-03: qty 4, 28d supply, fill #9
  Filled 2023-07-09: qty 4, 28d supply, fill #10

## 2022-09-02 ENCOUNTER — Other Ambulatory Visit (HOSPITAL_COMMUNITY): Payer: Self-pay

## 2022-09-02 ENCOUNTER — Other Ambulatory Visit: Payer: Self-pay

## 2022-09-02 NOTE — Telephone Encounter (Signed)
MyChart messgae sent to patient. 

## 2022-09-03 ENCOUNTER — Other Ambulatory Visit (HOSPITAL_COMMUNITY): Payer: Self-pay

## 2022-09-18 ENCOUNTER — Other Ambulatory Visit: Payer: Self-pay

## 2022-09-18 ENCOUNTER — Encounter: Payer: Self-pay | Admitting: Internal Medicine

## 2022-09-18 ENCOUNTER — Ambulatory Visit (INDEPENDENT_AMBULATORY_CARE_PROVIDER_SITE_OTHER): Payer: No Typology Code available for payment source | Admitting: Internal Medicine

## 2022-09-18 VITALS — BP 128/80 | HR 95 | Temp 98.0°F | Ht 66.0 in | Wt 226.4 lb

## 2022-09-18 DIAGNOSIS — I10 Essential (primary) hypertension: Secondary | ICD-10-CM | POA: Diagnosis not present

## 2022-09-18 DIAGNOSIS — F900 Attention-deficit hyperactivity disorder, predominantly inattentive type: Secondary | ICD-10-CM

## 2022-09-18 DIAGNOSIS — F4321 Adjustment disorder with depressed mood: Secondary | ICD-10-CM | POA: Diagnosis not present

## 2022-09-18 MED ORDER — MONTELUKAST SODIUM 10 MG PO TABS
10.0000 mg | ORAL_TABLET | Freq: Every day | ORAL | 1 refills | Status: DC
  Filled 2022-09-18: qty 90, 90d supply, fill #0
  Filled 2023-01-01: qty 90, 90d supply, fill #1

## 2022-09-18 MED ORDER — TRAZODONE HCL 50 MG PO TABS
50.0000 mg | ORAL_TABLET | Freq: Every day | ORAL | 1 refills | Status: DC
  Filled 2022-09-18 – 2023-06-10 (×2): qty 90, 90d supply, fill #0

## 2022-09-18 MED ORDER — AMPHETAMINE-DEXTROAMPHET ER 10 MG PO CP24
10.0000 mg | ORAL_CAPSULE | Freq: Every day | ORAL | 0 refills | Status: DC
  Filled 2022-09-18: qty 30, 30d supply, fill #0
  Filled 2022-09-23: qty 10, 10d supply, fill #0
  Filled 2022-09-23: qty 30, 30d supply, fill #0
  Filled 2022-09-23: qty 20, 20d supply, fill #0

## 2022-09-18 MED ORDER — AMPHETAMINE-DEXTROAMPHETAMINE 10 MG PO TABS
ORAL_TABLET | ORAL | 0 refills | Status: DC
  Filled 2022-09-18: qty 30, fill #0
  Filled 2022-09-23: qty 30, 30d supply, fill #0

## 2022-09-18 NOTE — Progress Notes (Unsigned)
Subjective:  Patient ID: Christopher Terrell, male    DOB: 1986-01-20  Age: 36 y.o. MRN: [id number]  CC: There were no encounter diagnoses.   HPI Christopher Terrell presents for  Chief Complaint  Patient presents with   Medical Management of Chronic Issues    ADHD, hypertension   1) ADD: taking 10 mg XR  in the morning and 10 mg IR In the afternoon   2) HTN: taking amlodipine and metoprolol    Outpatient Medications Prior to Visit  Medication Sig Dispense Refill   amLODipine (NORVASC) 2.5 MG tablet Take 1 tablet (2.5 mg total) by mouth daily. 90 tablet 3   amphetamine-dextroamphetamine (ADDERALL XR) 10 MG 24 hr capsule Take 1 capsule (10 mg total) by mouth daily. 30 capsule 0   amphetamine-dextroamphetamine (ADDERALL) 10 MG tablet TAKE 1 TABLET BY MOUTH IN THE AFTERNOON 30 tablet 0   chlorhexidine (PERIDEX) 0.12 % solution Rinse with 15 ml  2 (two) times daily for 30 seconds then expectorate. Do not eat/drink/rise for 30 minutes. 473 mL 3   clobetasol ointment (TEMOVATE) 0.05 % Apply to affected areas twice daily until smooth, then stop and use as needed. 45 g 1   clonazePAM (KLONOPIN) 0.25 MG disintegrating tablet Take 1 tablet (0.25 mg total) by mouth daily as needed. 30 tablet 2   dupilumab (DUPIXENT) 300 MG/2ML prefilled syringe Inject 300 mg syringe SQ every other week 4 mL 11   fluticasone (FLONASE) 50 MCG/ACT nasal spray Place 2 sprays into both nostrils daily. 16 g 4   metoprolol succinate (TOPROL-XL) 50 MG 24 hr tablet Take 1 tablet (50 mg total) by mouth daily. Take with or immediately following a meal. 90 tablet 3   montelukast (SINGULAIR) 10 MG tablet Take 1 tablet (10 mg total) by mouth at bedtime. 90 tablet 1   Sodium Fluoride 1.1 % PSTE Brush with pea sized amount for 2 minutes twice a day. Spit, do not swallow. Do not eat, drink or rinse mouth for 30 mins 100 mL 3   traZODone (DESYREL) 50 MG tablet Take 1 tablet (50 mg total) by mouth at bedtime. 90 tablet 1   COVID-19 At  Home Antigen Test (CARESTART COVID-19 HOME TEST) KIT USE AS DIRECTED WITHIN PACKAGE INSTRUCTIONS 2 kit 0   oseltamivir (TAMIFLU) 75 MG capsule Take 1 capsule (75 mg total) by mouth 2 (two) times daily for 5 days. 10 capsule 0   No facility-administered medications prior to visit.    Review of Systems;  Patient denies headache, fevers, malaise, unintentional weight loss, skin rash, eye pain, sinus congestion and sinus pain, sore throat, dysphagia,  hemoptysis , cough, dyspnea, wheezing, chest pain, palpitations, orthopnea, edema, abdominal pain, nausea, melena, diarrhea, constipation, flank pain, dysuria, hematuria, urinary  Frequency, nocturia, numbness, tingling, seizures,  Focal weakness, Loss of consciousness,  Tremor, insomnia, depression, anxiety, and suicidal ideation.      Objective:  BP 128/80   Pulse 95   Temp 98 F (36.7 C) (Oral)   Ht _0  (1.676 m)   Wt 226 lb 6.4 oz (102.7 kg)   SpO2 97%   BMI 36.54 kg/m   BP Readings from Last 3 Encounters:  09/18/22 128/80  03/19/22 132/80  11/21/21 122/80    Wt Readings from Last 3 Encounters:  09/18/22 226 lb 6.4 oz (102.7 kg)  03/19/22 219 lb 3.2 oz (99.4 kg)  11/21/21 221 lb 6.4 oz (100.4 kg)    Physical Exam  Lab Results  Component  Value Date   HGBA1C 5.5 01/07/2021    Lab Results  Component Value Date   CREATININE 0.91 01/07/2021   CREATININE 1.05 06/10/2020   CREATININE 0.89 03/16/2019    Lab Results  Component Value Date   WBC 8.6 11/03/2017   HGB 14.7 11/03/2017   HCT 42.9 11/03/2017   PLT 322.0 11/03/2017   GLUCOSE 87 01/07/2021   CHOL 178 01/07/2021   TRIG 132.0 01/07/2021   HDL 32.00 (L) 01/07/2021   LDLDIRECT 117.0 03/16/2016   LDLCALC 120 (H) 01/07/2021   ALT 62 (H) 01/07/2021   AST 39 (H) 01/07/2021   NA 139 01/07/2021   K 4.4 01/07/2021   CL 105 01/07/2021   CREATININE 0.91 01/07/2021   BUN 13 01/07/2021   CO2 27 01/07/2021   TSH 1.92 01/07/2021   HGBA1C 5.5 01/07/2021   MICROALBUR  1.2 01/07/2021    US Abdomen Limited RUQ (LIVER/GB)  Result Date: 02/25/2021 CLINICAL DATA:  Elevated liver enzymes. EXAM: ULTRASOUND ABDOMEN LIMITED RIGHT UPPER QUADRANT COMPARISON:  None. FINDINGS: Gallbladder: No gallstones or wall thickening visualized (2.8 mm). No sonographic Murphy sign noted by sonographer. Common bile duct: Diameter: 3.6 mm Liver: No focal lesion identified. Diffusely increased echogenicity of the liver parenchyma is noted. Portal vein is patent on color Doppler imaging with normal direction of blood flow towards the liver. Other: None. IMPRESSION: Fatty liver. Electronically Signed   By: Virgina Norfolk M.D.   On: 02/25/2021 22:56    Assessment & Plan:  .There are no diagnoses linked to this encounter.   I provided 30 minutes of face-to-face time during this encounter reviewing patient's last visit with me, patient's  most recent visit with cardiology,  nephrology,  and neurology,  recent surgical and non surgical procedures, previous  labs and imaging studies, counseling on currently addressed issues,  and post visit ordering to diagnostics and therapeutics .   Follow-up: No follow-ups on file.   Crecencio Mc, MD

## 2022-09-20 DIAGNOSIS — F4321 Adjustment disorder with depressed mood: Secondary | ICD-10-CM | POA: Insufficient documentation

## 2022-09-20 NOTE — Assessment & Plan Note (Addendum)
Patient is still dealing with the loss of his mother but  has adequate coping skills and emotional support .

## 2022-09-20 NOTE — Assessment & Plan Note (Signed)
His concentration deficit is managed with Adderall XR 10 mg daily with 10  mg of short acting adderall as needed for weekend or evening work .

## 2022-09-20 NOTE — Assessment & Plan Note (Addendum)
Well controlled on current regimen. Renal function is due, no changes today. °

## 2022-09-23 ENCOUNTER — Other Ambulatory Visit: Payer: Self-pay

## 2022-09-28 ENCOUNTER — Other Ambulatory Visit (HOSPITAL_COMMUNITY): Payer: Self-pay

## 2022-09-29 ENCOUNTER — Other Ambulatory Visit
Admission: RE | Admit: 2022-09-29 | Discharge: 2022-09-29 | Disposition: A | Discharge status: Home / Self Care | Payer: 59 | Attending: Internal Medicine | Admitting: Internal Medicine

## 2022-09-29 DIAGNOSIS — Z79899 Other long term (current) drug therapy: Secondary | ICD-10-CM

## 2022-09-29 DIAGNOSIS — E782 Mixed hyperlipidemia: Secondary | ICD-10-CM

## 2022-09-29 DIAGNOSIS — I10 Essential (primary) hypertension: Secondary | ICD-10-CM

## 2022-09-29 DIAGNOSIS — Z113 Encounter for screening for infections with a predominantly sexual mode of transmission: Secondary | ICD-10-CM

## 2022-09-29 DIAGNOSIS — R7301 Impaired fasting glucose: Secondary | ICD-10-CM | POA: Diagnosis present

## 2022-09-29 LAB — CBC WITH DIFFERENTIAL/PLATELET
Abs Immature Granulocytes: 0.04 10*3/uL (ref 0.00–0.07)
Basophils Absolute: 0.1 10*3/uL (ref 0.0–0.1)
Basophils Relative: 1 %
Eosinophils Absolute: 0.1 10*3/uL (ref 0.0–0.5)
Eosinophils Relative: 1 %
HCT: 43.1 % (ref 39.0–52.0)
Hemoglobin: 14.4 g/dL (ref 13.0–17.0)
Immature Granulocytes: 1 %
Lymphocytes Relative: 25 %
Lymphs Abs: 2.1 10*3/uL (ref 0.7–4.0)
MCH: 30 pg (ref 26.0–34.0)
MCHC: 33.4 g/dL (ref 30.0–36.0)
MCV: 89.8 fL (ref 80.0–100.0)
Monocytes Absolute: 1.1 10*3/uL — ABNORMAL HIGH (ref 0.1–1.0)
Monocytes Relative: 13 %
Neutro Abs: 5.2 10*3/uL (ref 1.7–7.7)
Neutrophils Relative %: 59 %
Platelets: 296 10*3/uL (ref 150–400)
RBC: 4.8 MIL/uL (ref 4.22–5.81)
RDW: 12.3 % (ref 11.5–15.5)
WBC: 8.6 10*3/uL (ref 4.0–10.5)
nRBC: 0 % (ref 0.0–0.2)

## 2022-09-29 LAB — COMPREHENSIVE METABOLIC PANEL
ALT: 70 U/L — ABNORMAL HIGH (ref 0–44)
AST: 46 U/L — ABNORMAL HIGH (ref 15–41)
Albumin: 4 g/dL (ref 3.5–5.0)
Alkaline Phosphatase: 55 U/L (ref 38–126)
Anion gap: 7 (ref 5–15)
BUN: 16 mg/dL (ref 6–20)
CO2: 25 mmol/L (ref 22–32)
Calcium: 8.7 mg/dL — ABNORMAL LOW (ref 8.9–10.3)
Chloride: 105 mmol/L (ref 98–111)
Creatinine, Ser: 0.94 mg/dL (ref 0.61–1.24)
GFR, Estimated: >60 mL/min (ref >60)
Glucose, Bld: 107 mg/dL — ABNORMAL HIGH (ref 70–99)
Potassium: 4.1 mmol/L (ref 3.5–5.1)
Sodium: 137 mmol/L (ref 135–145)
Total Bilirubin: 1 mg/dL (ref 0.3–1.2)
Total Protein: 7.6 g/dL (ref 6.5–8.1)

## 2022-09-29 LAB — LIPID PANEL
Cholesterol: 204 mg/dL — ABNORMAL HIGH (ref 0–200)
HDL: 36 mg/dL — ABNORMAL LOW (ref >40)
LDL Cholesterol: 145 mg/dL — ABNORMAL HIGH (ref 0–99)
Total CHOL/HDL Ratio: 5.7 RATIO
Triglycerides: 116 mg/dL (ref <150)
VLDL: 23 mg/dL (ref 0–40)

## 2022-09-29 LAB — HIV ANTIBODY (ROUTINE TESTING W REFLEX): HIV Screen 4th Generation wRfx: NONREACTIVE

## 2022-09-29 LAB — LDL CHOLESTEROL, DIRECT: Direct LDL: 157 mg/dL — ABNORMAL HIGH (ref 0–99)

## 2022-09-29 LAB — TSH: TSH: 3.332 u[IU]/mL (ref 0.350–4.500)

## 2022-09-29 NOTE — Addendum Note (Signed)
Addended by: Antonieta Iba C on: 09/29/2022 07:42 AM   Modules accepted: Orders

## 2022-09-29 NOTE — Addendum Note (Signed)
Addended by: Antonieta Iba C on: 09/29/2022 07:41 AM   Modules accepted: Orders

## 2022-09-30 ENCOUNTER — Other Ambulatory Visit: Payer: Self-pay

## 2022-09-30 LAB — HEMOGLOBIN A1C
Hgb A1c MFr Bld: 5.5 % (ref 4.8–5.6)
Mean Plasma Glucose: 111 mg/dL

## 2022-10-01 ENCOUNTER — Other Ambulatory Visit: Payer: Self-pay

## 2022-10-01 ENCOUNTER — Other Ambulatory Visit: Payer: Self-pay | Admitting: Internal Medicine

## 2022-10-01 DIAGNOSIS — I150 Renovascular hypertension: Secondary | ICD-10-CM

## 2022-10-01 LAB — MICROALBUMIN / CREATININE URINE RATIO
Creatinine, Urine: 172.6 mg/dL
Microalb Creat Ratio: 3 mg/g creat (ref 0–29)
Microalb, Ur: 5.8 ug/mL — ABNORMAL HIGH

## 2022-10-01 MED ORDER — LOSARTAN POTASSIUM 50 MG PO TABS
50.0000 mg | ORAL_TABLET | Freq: Every day | ORAL | 0 refills | Status: DC
  Filled 2022-10-01 – 2022-11-09 (×2): qty 90, 90d supply, fill #0

## 2022-10-01 NOTE — Assessment & Plan Note (Signed)
Changing amlodipine to losartan given new onset microalbuminuria.  Return in one week for BP and BMET   Lab Results  Component Value Date   MICROALBUR 5.8 (H) 09/29/2022   MICROALBUR 1.2 01/07/2021     Lab Results  Component Value Date   CREATININE 0.94 09/29/2022   Lab Results  Component Value Date   NA 137 09/29/2022   K 4.1 09/29/2022   CL 105 09/29/2022   CO2 25 09/29/2022

## 2022-10-16 ENCOUNTER — Other Ambulatory Visit: Payer: Self-pay

## 2022-10-21 ENCOUNTER — Other Ambulatory Visit (HOSPITAL_COMMUNITY): Payer: Self-pay

## 2022-10-23 ENCOUNTER — Other Ambulatory Visit (HOSPITAL_COMMUNITY): Payer: Self-pay

## 2022-10-26 ENCOUNTER — Other Ambulatory Visit (HOSPITAL_COMMUNITY): Payer: Self-pay

## 2022-10-28 ENCOUNTER — Other Ambulatory Visit (HOSPITAL_COMMUNITY): Payer: Self-pay

## 2022-11-09 ENCOUNTER — Other Ambulatory Visit: Payer: Self-pay | Admitting: Internal Medicine

## 2022-11-09 ENCOUNTER — Other Ambulatory Visit: Payer: Self-pay

## 2022-11-10 ENCOUNTER — Other Ambulatory Visit: Payer: Self-pay

## 2022-11-10 MED ORDER — AMPHETAMINE-DEXTROAMPHET ER 10 MG PO CP24
10.0000 mg | ORAL_CAPSULE | Freq: Every day | ORAL | 0 refills | Status: DC
  Filled 2022-11-10 (×2): qty 30, 30d supply, fill #0

## 2022-11-10 MED ORDER — AMPHETAMINE-DEXTROAMPHETAMINE 10 MG PO TABS
10.0000 mg | ORAL_TABLET | Freq: Every day | ORAL | 0 refills | Status: DC
  Filled 2022-11-10 (×2): qty 30, 30d supply, fill #0

## 2022-11-11 ENCOUNTER — Telehealth: Payer: Self-pay | Admitting: Pharmacist

## 2022-11-11 NOTE — Telephone Encounter (Signed)
Called patient to schedule an appointment for the [facility] [facility]. I was unable to reach the patient so I left a HIPAA-compliant message requesting that the patient return my call.   Luke Van Ausdall, PharmD, BCACP, CPP Clinical Pharmacist [facility] [phone]  

## 2022-11-12 DIAGNOSIS — L400 Psoriasis vulgaris: Secondary | ICD-10-CM | POA: Diagnosis not present

## 2022-11-12 DIAGNOSIS — L2089 Other atopic dermatitis: Secondary | ICD-10-CM | POA: Diagnosis not present

## 2022-11-13 ENCOUNTER — Other Ambulatory Visit (HOSPITAL_COMMUNITY): Payer: Self-pay

## 2022-11-19 ENCOUNTER — Other Ambulatory Visit (HOSPITAL_COMMUNITY): Payer: Self-pay

## 2022-11-24 ENCOUNTER — Other Ambulatory Visit (HOSPITAL_COMMUNITY): Payer: Self-pay

## 2022-12-02 ENCOUNTER — Encounter: Payer: Self-pay | Admitting: Internal Medicine

## 2022-12-16 ENCOUNTER — Other Ambulatory Visit (HOSPITAL_COMMUNITY): Payer: Self-pay

## 2022-12-22 ENCOUNTER — Other Ambulatory Visit: Payer: Self-pay

## 2023-01-01 ENCOUNTER — Other Ambulatory Visit: Payer: Self-pay | Admitting: Internal Medicine

## 2023-01-01 ENCOUNTER — Other Ambulatory Visit: Payer: Self-pay

## 2023-01-01 MED ORDER — AMPHETAMINE-DEXTROAMPHET ER 10 MG PO CP24
10.0000 mg | ORAL_CAPSULE | Freq: Every day | ORAL | 0 refills | Status: DC
  Filled 2023-01-01: qty 30, 30d supply, fill #0

## 2023-01-01 MED ORDER — AMPHETAMINE-DEXTROAMPHETAMINE 10 MG PO TABS
10.0000 mg | ORAL_TABLET | Freq: Every day | ORAL | 0 refills | Status: DC
  Filled 2023-01-01: qty 30, 30d supply, fill #0

## 2023-01-01 NOTE — Telephone Encounter (Signed)
Last Refilled: 11/10/2022 Last OV: 09/18/2022 Next OV: not scheduled

## 2023-01-19 ENCOUNTER — Other Ambulatory Visit (HOSPITAL_COMMUNITY): Payer: Self-pay

## 2023-01-20 ENCOUNTER — Other Ambulatory Visit (HOSPITAL_COMMUNITY): Payer: Self-pay

## 2023-01-22 ENCOUNTER — Telehealth: Payer: Self-pay | Admitting: Pharmacist

## 2023-01-22 NOTE — Telephone Encounter (Signed)
Called patient to schedule an appointment for the [facility]. I was unable to reach the patient so I left a HIPAA-compliant message requesting that the patient return my call.   Luke Van Ausdall, PharmD, BCACP, CPP Clinical Pharmacist [facility] [phone]  

## 2023-02-12 ENCOUNTER — Other Ambulatory Visit (HOSPITAL_COMMUNITY): Payer: Self-pay

## 2023-02-16 ENCOUNTER — Other Ambulatory Visit (HOSPITAL_COMMUNITY): Payer: Self-pay

## 2023-03-05 ENCOUNTER — Other Ambulatory Visit: Payer: Self-pay | Admitting: Internal Medicine

## 2023-03-05 ENCOUNTER — Other Ambulatory Visit: Payer: Self-pay

## 2023-03-05 NOTE — Telephone Encounter (Signed)
Refilled: both on 01/01/2023 Last OV: 09/19/2023 Next OV: not scheduled

## 2023-03-08 ENCOUNTER — Other Ambulatory Visit: Payer: Self-pay | Admitting: Internal Medicine

## 2023-03-08 MED ORDER — AMPHETAMINE-DEXTROAMPHETAMINE 10 MG PO TABS
10.0000 mg | ORAL_TABLET | Freq: Every day | ORAL | 0 refills | Status: DC
  Filled 2023-03-08: qty 30, 30d supply, fill #0

## 2023-03-08 MED ORDER — AMPHETAMINE-DEXTROAMPHET ER 10 MG PO CP24
10.0000 mg | ORAL_CAPSULE | Freq: Every day | ORAL | 0 refills | Status: DC
  Filled 2023-03-08: qty 30, 30d supply, fill #0

## 2023-03-09 ENCOUNTER — Other Ambulatory Visit: Payer: Self-pay

## 2023-03-09 MED ORDER — LOSARTAN POTASSIUM 50 MG PO TABS
50.0000 mg | ORAL_TABLET | Freq: Every day | ORAL | 0 refills | Status: DC
  Filled 2023-03-09: qty 30, 30d supply, fill #0

## 2023-03-19 ENCOUNTER — Other Ambulatory Visit (HOSPITAL_COMMUNITY): Payer: Self-pay

## 2023-03-26 ENCOUNTER — Ambulatory Visit: Payer: 59 | Admitting: Internal Medicine

## 2023-04-05 ENCOUNTER — Other Ambulatory Visit (HOSPITAL_COMMUNITY): Payer: Self-pay

## 2023-04-21 ENCOUNTER — Encounter (INDEPENDENT_AMBULATORY_CARE_PROVIDER_SITE_OTHER): Payer: Self-pay

## 2023-05-03 ENCOUNTER — Other Ambulatory Visit: Payer: Self-pay

## 2023-05-05 ENCOUNTER — Other Ambulatory Visit: Payer: Self-pay

## 2023-05-10 ENCOUNTER — Other Ambulatory Visit (HOSPITAL_COMMUNITY): Payer: Self-pay

## 2023-05-19 ENCOUNTER — Encounter: Payer: Self-pay | Admitting: Internal Medicine

## 2023-05-19 ENCOUNTER — Ambulatory Visit (INDEPENDENT_AMBULATORY_CARE_PROVIDER_SITE_OTHER): Payer: 59 | Admitting: Internal Medicine

## 2023-05-19 ENCOUNTER — Other Ambulatory Visit: Payer: Self-pay

## 2023-05-19 VITALS — BP 126/84 | HR 80 | Temp 98.1°F | Ht 66.0 in | Wt 219.4 lb

## 2023-05-19 DIAGNOSIS — Z0000 Encounter for general adult medical examination without abnormal findings: Secondary | ICD-10-CM | POA: Diagnosis not present

## 2023-05-19 DIAGNOSIS — K760 Fatty (change of) liver, not elsewhere classified: Secondary | ICD-10-CM | POA: Diagnosis not present

## 2023-05-19 DIAGNOSIS — F900 Attention-deficit hyperactivity disorder, predominantly inattentive type: Secondary | ICD-10-CM | POA: Diagnosis not present

## 2023-05-19 DIAGNOSIS — Z23 Encounter for immunization: Secondary | ICD-10-CM

## 2023-05-19 DIAGNOSIS — E782 Mixed hyperlipidemia: Secondary | ICD-10-CM

## 2023-05-19 DIAGNOSIS — I10 Essential (primary) hypertension: Secondary | ICD-10-CM | POA: Diagnosis not present

## 2023-05-19 DIAGNOSIS — E669 Obesity, unspecified: Secondary | ICD-10-CM | POA: Diagnosis not present

## 2023-05-19 MED ORDER — MONTELUKAST SODIUM 10 MG PO TABS
10.0000 mg | ORAL_TABLET | Freq: Every day | ORAL | 1 refills | Status: DC
  Filled 2023-05-19: qty 90, 90d supply, fill #0

## 2023-05-19 MED ORDER — LOSARTAN POTASSIUM 50 MG PO TABS
50.0000 mg | ORAL_TABLET | Freq: Every day | ORAL | 1 refills | Status: DC
  Filled 2023-05-19: qty 90, 90d supply, fill #0

## 2023-05-19 MED ORDER — METOPROLOL SUCCINATE ER 50 MG PO TB24
50.0000 mg | ORAL_TABLET | Freq: Every day | ORAL | 3 refills | Status: DC
  Filled 2023-05-19: qty 90, 90d supply, fill #0

## 2023-05-19 MED ORDER — METOPROLOL SUCCINATE ER 100 MG PO TB24
100.0000 mg | ORAL_TABLET | Freq: Every day | ORAL | 2 refills | Status: DC
  Filled 2023-05-19: qty 30, 30d supply, fill #0

## 2023-05-19 MED ORDER — AMPHETAMINE-DEXTROAMPHETAMINE 7.5 MG PO TABS
7.5000 mg | ORAL_TABLET | Freq: Every day | ORAL | 0 refills | Status: DC
  Filled 2023-05-19: qty 30, 30d supply, fill #0

## 2023-05-19 NOTE — Progress Notes (Signed)
Patient ID: Christopher Terrell [place], male    DOB: 1986-07-23  Age: 37 y.o. MRN: [id number]  The patient is here for annual preventive examination and management of other chronic and acute problems.   The risk factors are reflected in the social history.   The roster of all physicians providing medical care to patient - is listed in the Snapshot section of the chart.   Activities of daily living:  The patient is 100% independent in all ADLs: dressing, toileting, feeding as well as independent mobility   Home safety : The patient has smoke detectors in the home. They wear seatbelts.  There are no unsecured firearms at home. There is no violence in the home.    There is no risks for hepatitis, STDs or HIV. There is no   history of blood transfusion. They have no travel history to infectious disease endemic areas of the world.   The patient has seen their dentist in the last six month. They have seen their eye doctor in the last year. The patinet  denies slight hearing difficulty with regard to whispered voices and some television programs.  They have deferred audiologic testing in the last year.  They do not  have excessive sun exposure. Discussed the need for sun protection: hats, long sleeves and use of sunscreen if there is significant sun exposure.    Diet: the importance of a healthy diet is discussed. They do have a healthy diet.   The benefits of regular aerobic exercise were discussed. The patient  exercises  3 to 5 days per week  for  60 minutes.    Depression screen: there are no signs or vegative symptoms of depression- irritability, change in appetite, anhedonia, sadness/tearfullness.   The following portions of the patient's history were reviewed and updated as appropriate: allergies, current medications, past family history, past medical history,  past surgical history, past social history  and problem list.   Visual acuity was not assessed per patient preference since the patient has  regular follow up with an  ophthalmologist. Hearing and body mass index were assessed and reviewed.    During the course of the visit the patient was educated and counseled about appropriate screening and preventive services including : fall prevention , diabetes screening, nutrition counseling, colorectal cancer screening, and recommended immunizations.    Chief Complaint:  No new issues.       Review of Symptoms  Patient denies headache, fevers, malaise, unintentional weight loss, skin rash, eye pain, sinus congestion and sinus pain, sore throat, dysphagia,  hemoptysis , cough, dyspnea, wheezing, chest pain, palpitations, orthopnea, edema, abdominal pain, nausea, melena, diarrhea, constipation, flank pain, dysuria, hematuria, urinary  Frequency, nocturia, numbness, tingling, seizures,  Focal weakness, Loss of consciousness,  Tremor, insomnia, depression, anxiety, and suicidal ideation.    Physical Exam:  BP 126/84   Pulse 80   Temp 98.1 F (36.7 C) (Oral)   Ht 5\' 6"  (1.676 m)   Wt 219 lb 6.4 oz (99.5 kg)   SpO2 96%   BMI 35.41 kg/m    Physical Exam Vitals reviewed.  Constitutional:      General: He is not in acute distress.    Appearance: Normal appearance. He is normal weight. He is not ill-appearing, toxic-appearing or diaphoretic.  HENT:     Head: Normocephalic and atraumatic.     Right Ear: Tympanic membrane, ear canal and external ear normal. There is no impacted cerumen.     Left Ear: Tympanic membrane, ear  canal and external ear normal. There is no impacted cerumen.     Nose: Nose normal.     Mouth/Throat:     Mouth: Mucous membranes are moist.     Pharynx: Oropharynx is clear.  Eyes:     General: No scleral icterus.       Right eye: No discharge.        Left eye: No discharge.     Conjunctiva/sclera: Conjunctivae normal.  Neck:     Thyroid: No thyromegaly.     Vascular: No carotid bruit or JVD.  Cardiovascular:     Rate and Rhythm: Normal rate and regular  rhythm.     Heart sounds: Normal heart sounds.  Pulmonary:     Effort: Pulmonary effort is normal. No respiratory distress.     Breath sounds: Normal breath sounds.  Abdominal:     General: Bowel sounds are normal.     Palpations: Abdomen is soft. There is no mass.     Tenderness: There is no abdominal tenderness. There is no guarding or rebound.  Musculoskeletal:        General: Normal range of motion.     Cervical back: Normal range of motion and neck supple.  Lymphadenopathy:     Cervical: No cervical adenopathy.  Skin:    General: Skin is warm and dry.  Neurological:     General: No focal deficit present.     Mental Status: He is alert and oriented to person, place, and time. Mental status is at baseline.  Psychiatric:        Mood and Affect: Mood normal.        Behavior: Behavior normal.        Thought Content: Thought content normal.        Judgment: Judgment normal.     Assessment and Plan: Primary hypertension Assessment & Plan: Changing therapy to simplify regimen.  Increase metoprolol and stop losartan  Orders: -     Comprehensive metabolic panel; Future  Encounter for preventive health examination Assessment & Plan: age appropriate education and counseling updated, referrals for preventative services and immunizations addressed, dietary and smoking counseling addressed, most recent labs reviewed.  I have personally reviewed and have noted:   1) the patient's medical and social history 2) The pt's use of alcohol, tobacco, and illicit drugs 3) The patient's current medications and supplements 4) Functional ability including ADL's, fall risk, home safety risk, hearing and visual impairment 5) Diet and physical activities 6) Evidence for depression or mood disorder 7) The patient's height, weight, and BMI have been recorded in the chart     I have made referrals, and provided counseling and education based on review of the above    Obesity (BMI  30.0-34.9) Assessment & Plan: I have congratulated him  in reduction of  weight since last visit (7 lbs) and encouraged  Continued weight loss with goal of 10% of body weigh over the next 6 months using a low glycemic index diet and regular exercise a minimum of 5 days per week.    Orders: -     CBC with Differential/Platelet; Future -     Comprehensive metabolic panel; Future -     LDL cholesterol, direct; Future -     Lipid panel; Future  Elevated triglycerides with high cholesterol -     LDL cholesterol, direct; Future -     Lipid panel; Future  Encounter for immunization -     Tdap vaccine greater than or  equal to 7yo IM  Hepatic steatosis Assessment & Plan: suggested  by ultrasound changes and serologies negative for autoimmune causes of hepatitis. all modifiable risk factors including obesity, diabetes and hyperlipidemia have been addressed .  He is overdue for surveillance and will have labs done at his office when he is fasting   Lab Results  Component Value Date   ALT 70 (H) 09/29/2022   AST 46 (H) 09/29/2022   ALKPHOS 55 09/29/2022   BILITOT 1.0 09/29/2022      Attention deficit hyperactivity disorder (ADHD), predominantly inattentive type Assessment & Plan: His concentration deficit is managed with Adderall XR 10 mg daily with 10  mg of short acting adderall as needed for weekend or evening work . Today we are reducing his afternoon dose to 7.5 mg    Other orders -     Montelukast Sodium; Take 1 tablet (10 mg total) by mouth at bedtime.  Dispense: 90 tablet; Refill: 1 -     Amphetamine-Dextroamphetamine; Take 1 tablet by mouth daily in the afternoon.  Dispense: 30 tablet; Refill: 0 -     Metoprolol Succinate ER; Take 1 tablet (100 mg total) by mouth daily. Take with or immediately following a meal.  Dispense: 30 tablet; Refill: 2    Return in about 6 months (around 11/19/2023).  Sherlene Shams, MD

## 2023-05-19 NOTE — Patient Instructions (Signed)
Stop the losartan and increase metoprolol to 100 mg daily  Afternoon dose of adderall has been reduced to 7.5 mg daily  TDaP given today

## 2023-05-21 NOTE — Assessment & Plan Note (Signed)
I have congratulated him  in reduction of  weight since last visit (7 lbs) and encouraged  Continued weight loss with goal of 10% of body weigh over the next 6 months using a low glycemic index diet and regular exercise a minimum of 5 days per week.

## 2023-05-21 NOTE — Assessment & Plan Note (Signed)
His concentration deficit is managed with Adderall XR 10 mg daily with 10  mg of short acting adderall as needed for weekend or evening work . Today we are reducing his afternoon dose to 7.5 mg

## 2023-05-21 NOTE — Assessment & Plan Note (Signed)
suggested  by ultrasound changes and serologies negative for autoimmune causes of hepatitis. all modifiable risk factors including obesity, diabetes and hyperlipidemia have been addressed .  He is overdue for surveillance and will have labs done at his office when he is fasting   Lab Results  Component Value Date   ALT 70 (H) 09/29/2022   AST 46 (H) 09/29/2022   ALKPHOS 55 09/29/2022   BILITOT 1.0 09/29/2022

## 2023-05-21 NOTE — Assessment & Plan Note (Signed)
Changing therapy to simplify regimen.  Increase metoprolol and stop losartan

## 2023-05-21 NOTE — Assessment & Plan Note (Signed)

## 2023-05-25 ENCOUNTER — Other Ambulatory Visit
Admission: RE | Admit: 2023-05-25 | Discharge: 2023-05-25 | Disposition: A | Discharge status: Home / Self Care | Payer: 59 | Attending: Internal Medicine | Admitting: Internal Medicine

## 2023-05-25 ENCOUNTER — Other Ambulatory Visit: Payer: Self-pay

## 2023-05-25 DIAGNOSIS — E669 Obesity, unspecified: Secondary | ICD-10-CM | POA: Diagnosis not present

## 2023-05-25 DIAGNOSIS — I10 Essential (primary) hypertension: Secondary | ICD-10-CM | POA: Diagnosis not present

## 2023-05-25 DIAGNOSIS — E782 Mixed hyperlipidemia: Secondary | ICD-10-CM | POA: Insufficient documentation

## 2023-05-25 LAB — COMPREHENSIVE METABOLIC PANEL
ALT: 111 U/L — ABNORMAL HIGH (ref 0–44)
AST: 73 U/L — ABNORMAL HIGH (ref 15–41)
Albumin: 4.3 g/dL (ref 3.5–5.0)
Alkaline Phosphatase: 61 U/L (ref 38–126)
Anion gap: 12 (ref 5–15)
BUN: 18 mg/dL (ref 6–20)
CO2: 24 mmol/L (ref 22–32)
Calcium: 8.9 mg/dL (ref 8.9–10.3)
Chloride: 102 mmol/L (ref 98–111)
Creatinine, Ser: 0.93 mg/dL (ref 0.61–1.24)
GFR, Estimated: >60 mL/min (ref >60)
Glucose, Bld: 92 mg/dL (ref 70–99)
Potassium: 3.9 mmol/L (ref 3.5–5.1)
Sodium: 138 mmol/L (ref 135–145)
Total Bilirubin: 0.7 mg/dL (ref 0.3–1.2)
Total Protein: 8.1 g/dL (ref 6.5–8.1)

## 2023-05-25 LAB — CBC WITH DIFFERENTIAL/PLATELET
Abs Immature Granulocytes: 0.03 10*3/uL (ref 0.00–0.07)
Basophils Absolute: 0.1 10*3/uL (ref 0.0–0.1)
Basophils Relative: 1 %
Eosinophils Absolute: 0.1 10*3/uL (ref 0.0–0.5)
Eosinophils Relative: 1 %
HCT: 38.8 % — ABNORMAL LOW (ref 39.0–52.0)
Hemoglobin: 13.5 g/dL (ref 13.0–17.0)
Immature Granulocytes: 0 %
Lymphocytes Relative: 24 %
Lymphs Abs: 2.6 10*3/uL (ref 0.7–4.0)
MCH: 31.2 pg (ref 26.0–34.0)
MCHC: 34.8 g/dL (ref 30.0–36.0)
MCV: 89.6 fL (ref 80.0–100.0)
Monocytes Absolute: 1.2 10*3/uL — ABNORMAL HIGH (ref 0.1–1.0)
Monocytes Relative: 11 %
Neutro Abs: 6.8 10*3/uL (ref 1.7–7.7)
Neutrophils Relative %: 63 %
Platelets: 264 10*3/uL (ref 150–400)
RBC: 4.33 MIL/uL (ref 4.22–5.81)
RDW: 12.2 % (ref 11.5–15.5)
WBC: 10.8 10*3/uL — ABNORMAL HIGH (ref 4.0–10.5)
nRBC: 0 % (ref 0.0–0.2)

## 2023-05-25 LAB — LIPID PANEL
Cholesterol: 213 mg/dL — ABNORMAL HIGH (ref 0–200)
HDL: 39 mg/dL — ABNORMAL LOW (ref >40)
LDL Cholesterol: 133 mg/dL — ABNORMAL HIGH (ref 0–99)
Total CHOL/HDL Ratio: 5.5 ratio
Triglycerides: 207 mg/dL — ABNORMAL HIGH (ref <150)
VLDL: 41 mg/dL — ABNORMAL HIGH (ref 0–40)

## 2023-05-25 LAB — LDL CHOLESTEROL, DIRECT: Direct LDL: 150 mg/dL — ABNORMAL HIGH (ref 0–99)

## 2023-05-27 ENCOUNTER — Other Ambulatory Visit: Payer: Self-pay | Admitting: Internal Medicine

## 2023-05-27 DIAGNOSIS — R7401 Elevation of levels of liver transaminase levels: Secondary | ICD-10-CM

## 2023-05-27 DIAGNOSIS — K7581 Nonalcoholic steatohepatitis (NASH): Secondary | ICD-10-CM

## 2023-05-27 NOTE — Assessment & Plan Note (Signed)
Liver enzymes are  more elevated.  Repeat in 2 weeks.

## 2023-06-03 ENCOUNTER — Other Ambulatory Visit (HOSPITAL_COMMUNITY): Payer: Self-pay

## 2023-06-10 ENCOUNTER — Encounter: Payer: Self-pay | Admitting: Internal Medicine

## 2023-06-10 ENCOUNTER — Other Ambulatory Visit (HOSPITAL_COMMUNITY): Payer: Self-pay

## 2023-06-10 ENCOUNTER — Other Ambulatory Visit: Payer: Self-pay

## 2023-06-10 DIAGNOSIS — I10 Essential (primary) hypertension: Secondary | ICD-10-CM

## 2023-06-12 ENCOUNTER — Encounter (HOSPITAL_COMMUNITY): Payer: Self-pay

## 2023-06-14 ENCOUNTER — Other Ambulatory Visit: Payer: Self-pay

## 2023-06-14 MED ORDER — METOPROLOL SUCCINATE ER 50 MG PO TB24
50.0000 mg | ORAL_TABLET | Freq: Every day | ORAL | 1 refills | Status: DC
  Filled 2023-06-14: qty 90, 90d supply, fill #0
  Filled 2023-10-07: qty 90, 90d supply, fill #1

## 2023-06-14 MED ORDER — LOSARTAN POTASSIUM 50 MG PO TABS
50.0000 mg | ORAL_TABLET | Freq: Every day | ORAL | 1 refills | Status: DC
  Filled 2023-06-14: qty 90, 90d supply, fill #0
  Filled 2023-10-07: qty 90, 90d supply, fill #1

## 2023-06-14 NOTE — Assessment & Plan Note (Signed)
Resuming prior therapy  per patient prefernce   reduce metoprolol and  resume losartan

## 2023-06-15 ENCOUNTER — Inpatient Hospital Stay: Admit: 2023-06-15 | Payer: 59

## 2023-06-15 ENCOUNTER — Other Ambulatory Visit: Payer: Self-pay | Admitting: Internal Medicine

## 2023-06-15 ENCOUNTER — Other Ambulatory Visit: Payer: Self-pay

## 2023-06-15 ENCOUNTER — Other Ambulatory Visit
Admission: RE | Admit: 2023-06-15 | Discharge: 2023-06-15 | Disposition: A | Discharge status: Home / Self Care | Payer: 59 | Attending: Internal Medicine | Admitting: Internal Medicine

## 2023-06-15 DIAGNOSIS — R7401 Elevation of levels of liver transaminase levels: Secondary | ICD-10-CM | POA: Insufficient documentation

## 2023-06-15 LAB — HEPATIC FUNCTION PANEL
ALT: 82 U/L — ABNORMAL HIGH (ref 0–44)
AST: 42 U/L — ABNORMAL HIGH (ref 15–41)
Albumin: 4 g/dL (ref 3.5–5.0)
Alkaline Phosphatase: 58 U/L (ref 38–126)
Bilirubin, Direct: <0.1 mg/dL (ref 0.0–0.2)
Total Bilirubin: 0.6 mg/dL (ref 0.3–1.2)
Total Protein: 7.3 g/dL (ref 6.5–8.1)

## 2023-06-15 MED ORDER — CLONAZEPAM 0.25 MG PO TBDP
0.2500 mg | ORAL_TABLET | Freq: Every day | ORAL | 2 refills | Status: DC | PRN
  Filled 2023-06-15: qty 30, 30d supply, fill #0
  Filled 2023-10-07: qty 30, 30d supply, fill #1

## 2023-06-16 ENCOUNTER — Other Ambulatory Visit: Payer: Self-pay | Admitting: Internal Medicine

## 2023-06-16 DIAGNOSIS — K7581 Nonalcoholic steatohepatitis (NASH): Secondary | ICD-10-CM

## 2023-06-17 ENCOUNTER — Telehealth: Payer: Self-pay | Admitting: Internal Medicine

## 2023-06-17 NOTE — Telephone Encounter (Signed)
Lft pt vm to call ofc . thanks 

## 2023-06-22 ENCOUNTER — Ambulatory Visit: Payer: 59 | Admitting: Internal Medicine

## 2023-07-07 ENCOUNTER — Other Ambulatory Visit (HOSPITAL_COMMUNITY): Payer: Self-pay

## 2023-07-09 ENCOUNTER — Other Ambulatory Visit: Payer: Self-pay

## 2023-07-09 ENCOUNTER — Other Ambulatory Visit (HOSPITAL_COMMUNITY): Payer: Self-pay

## 2023-07-09 ENCOUNTER — Encounter (HOSPITAL_COMMUNITY): Payer: Self-pay

## 2023-07-09 NOTE — Progress Notes (Signed)
Specialty Pharmacy Refill Coordination Note  Christopher Terrell [place] is a 37 y.o. male contacted today regarding refills of specialty medication(s) No data recorded  Patient requested Delivery   Delivery date: 07/23/23   Verified address: [address] [place] [address]   Medication will be filled on 07/22/23.

## 2023-07-20 DIAGNOSIS — L400 Psoriasis vulgaris: Secondary | ICD-10-CM | POA: Diagnosis not present

## 2023-07-20 DIAGNOSIS — L2089 Other atopic dermatitis: Secondary | ICD-10-CM | POA: Diagnosis not present

## 2023-07-20 DIAGNOSIS — D225 Melanocytic nevi of trunk: Secondary | ICD-10-CM | POA: Diagnosis not present

## 2023-08-12 ENCOUNTER — Other Ambulatory Visit: Payer: Self-pay

## 2023-08-17 ENCOUNTER — Other Ambulatory Visit: Payer: Self-pay

## 2023-09-13 ENCOUNTER — Other Ambulatory Visit: Payer: Self-pay | Admitting: Pharmacist

## 2023-09-13 ENCOUNTER — Other Ambulatory Visit: Payer: Self-pay

## 2023-09-13 ENCOUNTER — Encounter (HOSPITAL_COMMUNITY): Payer: Self-pay

## 2023-09-13 ENCOUNTER — Ambulatory Visit: Payer: 59 | Attending: Internal Medicine | Admitting: Pharmacist

## 2023-09-13 ENCOUNTER — Encounter: Payer: Self-pay | Admitting: Pharmacist

## 2023-09-13 DIAGNOSIS — Z79899 Other long term (current) drug therapy: Secondary | ICD-10-CM

## 2023-09-13 NOTE — Progress Notes (Signed)
Specialty Pharmacy Refill Coordination Note  Christopher Terrell [place] is a 37 y.o. male contacted today regarding refills of specialty medication(s) Dupilumab (Dupixent)   Patient requested Delivery   Delivery date: 09/17/23   Verified address: [address]   [place] [address]   Medication will be filled on 09/16/23.

## 2023-09-13 NOTE — Progress Notes (Signed)
   S: Patient presents for review of their specialty medication therapy.  Patient is currently taking Dupixent for atopic dermatitis. Patient is managed by Dr. Adolphus Birchwood for this.   Adherence: denies any missed doses  Efficacy: reports that it continues to work well for him and denies any adverse effects  Dosing: 300 mg every 2 weeks  Monitoring: S/sx of infection: denies Hypersensitivity reaction: denies Ocular effects: denies S/sx of eosinophilia/vasculitis: denies  O:      Lab Results  Component Value Date   WBC 10.8 (H) 05/25/2023   HGB 13.5 05/25/2023   HCT 38.8 (L) 05/25/2023   MCV 89.6 05/25/2023   PLT 264 05/25/2023      Chemistry      Component Value Date/Time   NA 138 05/25/2023 1549   NA 138 04/27/2014 0741   NA 138 04/27/2014 0741   K 3.9 05/25/2023 1549   K 4.1 04/27/2014 0741   CL 102 05/25/2023 1549   CL 104 04/27/2014 0741   CO2 24 05/25/2023 1549   CO2 26 04/27/2014 0741   BUN 18 05/25/2023 1549   BUN 19 04/27/2014 0741   BUN 19 (H) 04/27/2014 0741   CREATININE 0.93 05/25/2023 1549   CREATININE 1.12 04/27/2014 0741   GLU 102 04/27/2014 0741      Component Value Date/Time   CALCIUM 8.9 05/25/2023 1549   CALCIUM 8.7 04/27/2014 0741   ALKPHOS 58 06/15/2023 1715   ALKPHOS 78 04/27/2014 0741   AST 42 (H) 06/15/2023 1715   AST 25 04/27/2014 0741   ALT 82 (H) 06/15/2023 1715   ALT 40 04/27/2014 0741   BILITOT 0.6 06/15/2023 1715   BILITOT 0.6 04/27/2014 0741       A/P: 1. Medication review: Patient currently on Dupixent for atopic dermatitis and is tolerating it well with no adverse effects and good control of his atopic dermatitis. Reviewed medication with patient, including the following: Dupixent is a monoclonal antibody used for atopic dermatitis and asthma. Possible adverse effects include increased risk of infection, ocular effects, injection site reactions, and eosinophilia/vasculitis. No recommendations for any changes.  Butch Penny, PharmD, Patsy [place], CPP Clinical Pharmacist [facility] [phone]

## 2023-09-13 NOTE — Progress Notes (Signed)
 Please see OV from today for full documentation.   Butch Penny, PharmD, Patsy [place], CPP Clinical Pharmacist [facility] [phone]

## 2023-09-14 ENCOUNTER — Other Ambulatory Visit: Payer: Self-pay

## 2023-09-16 ENCOUNTER — Other Ambulatory Visit (HOSPITAL_COMMUNITY): Payer: Self-pay

## 2023-09-20 ENCOUNTER — Other Ambulatory Visit (HOSPITAL_COMMUNITY): Payer: Self-pay

## 2023-09-21 ENCOUNTER — Other Ambulatory Visit (HOSPITAL_COMMUNITY): Payer: Self-pay

## 2023-09-23 ENCOUNTER — Other Ambulatory Visit (HOSPITAL_COMMUNITY): Payer: Self-pay

## 2023-09-23 ENCOUNTER — Other Ambulatory Visit: Payer: Self-pay

## 2023-09-23 ENCOUNTER — Other Ambulatory Visit: Payer: Self-pay | Admitting: Pharmacist

## 2023-09-23 MED ORDER — DUPIXENT 300 MG/2ML ~~LOC~~ SOSY
PREFILLED_SYRINGE | SUBCUTANEOUS | 11 refills | Status: DC
  Filled 2023-09-23 – 2023-10-13 (×2): qty 4, 28d supply, fill #0
  Filled 2023-11-18: qty 4, 28d supply, fill #1
  Filled 2023-12-10: qty 4, 28d supply, fill #2
  Filled 2024-01-05 (×2): qty 4, 28d supply, fill #3
  Filled 2024-03-19: qty 4, 28d supply, fill #4
  Filled 2024-04-10 – 2024-04-12 (×2): qty 4, 28d supply, fill #5
  Filled 2024-05-10: qty 4, 28d supply, fill #6
  Filled 2024-06-07: qty 4, 28d supply, fill #7
  Filled 2024-07-13: qty 4, 28d supply, fill #8
  Filled 2024-08-14 – 2024-08-22 (×2): qty 4, 28d supply, fill #9
  Filled 2024-09-12: qty 4, 28d supply, fill #10

## 2023-09-23 MED ORDER — DUPIXENT 300 MG/2ML ~~LOC~~ SOSY
PREFILLED_SYRINGE | SUBCUTANEOUS | 11 refills | Status: DC
Start: 2023-09-23 — End: 2023-09-23

## 2023-09-27 ENCOUNTER — Other Ambulatory Visit: Payer: Self-pay

## 2023-09-30 ENCOUNTER — Other Ambulatory Visit: Payer: Self-pay

## 2023-09-30 ENCOUNTER — Other Ambulatory Visit (HOSPITAL_COMMUNITY): Payer: Self-pay

## 2023-09-30 MED ORDER — TOBRAMYCIN-DEXAMETHASONE 0.3-0.1 % OP SUSP
1.0000 [drp] | Freq: Four times a day (QID) | OPHTHALMIC | 1 refills | Status: DC | PRN
Start: 2023-09-29 — End: 2023-11-18
  Filled 2023-09-30: qty 5, 13d supply, fill #0

## 2023-10-07 ENCOUNTER — Other Ambulatory Visit: Payer: Self-pay

## 2023-10-07 ENCOUNTER — Other Ambulatory Visit (HOSPITAL_COMMUNITY): Payer: Self-pay

## 2023-10-07 ENCOUNTER — Other Ambulatory Visit: Payer: Self-pay | Admitting: Internal Medicine

## 2023-10-07 MED ORDER — AMPHETAMINE-DEXTROAMPHET ER 10 MG PO CP24
10.0000 mg | ORAL_CAPSULE | Freq: Every day | ORAL | 0 refills | Status: DC
  Filled 2023-10-07: qty 30, 30d supply, fill #0

## 2023-10-07 MED ORDER — TRAZODONE HCL 50 MG PO TABS
50.0000 mg | ORAL_TABLET | Freq: Every day | ORAL | 1 refills | Status: DC
  Filled 2023-10-07: qty 90, 90d supply, fill #0
  Filled 2024-03-27: qty 90, 90d supply, fill #1

## 2023-10-07 MED ORDER — AMPHETAMINE-DEXTROAMPHETAMINE 7.5 MG PO TABS
7.5000 mg | ORAL_TABLET | Freq: Every day | ORAL | 0 refills | Status: DC
  Filled 2023-10-07 (×2): qty 30, 30d supply, fill #0

## 2023-10-13 ENCOUNTER — Other Ambulatory Visit: Payer: Self-pay

## 2023-10-14 ENCOUNTER — Other Ambulatory Visit: Payer: Self-pay

## 2023-11-02 ENCOUNTER — Other Ambulatory Visit: Payer: Self-pay

## 2023-11-08 ENCOUNTER — Other Ambulatory Visit: Payer: Self-pay

## 2023-11-18 ENCOUNTER — Other Ambulatory Visit: Payer: Self-pay

## 2023-11-18 ENCOUNTER — Other Ambulatory Visit (HOSPITAL_COMMUNITY): Payer: Self-pay

## 2023-11-18 ENCOUNTER — Ambulatory Visit: Payer: Commercial Managed Care - PPO | Admitting: Internal Medicine

## 2023-11-18 ENCOUNTER — Ambulatory Visit: Payer: 59 | Admitting: Internal Medicine

## 2023-11-18 ENCOUNTER — Encounter: Payer: Self-pay | Admitting: Internal Medicine

## 2023-11-18 ENCOUNTER — Other Ambulatory Visit: Payer: Commercial Managed Care - PPO

## 2023-11-18 VITALS — BP 120/74 | HR 105 | Ht 66.0 in | Wt 233.8 lb

## 2023-11-18 DIAGNOSIS — I10 Essential (primary) hypertension: Secondary | ICD-10-CM | POA: Diagnosis not present

## 2023-11-18 DIAGNOSIS — L400 Psoriasis vulgaris: Secondary | ICD-10-CM

## 2023-11-18 DIAGNOSIS — Z79899 Other long term (current) drug therapy: Secondary | ICD-10-CM | POA: Diagnosis not present

## 2023-11-18 DIAGNOSIS — K7581 Nonalcoholic steatohepatitis (NASH): Secondary | ICD-10-CM

## 2023-11-18 DIAGNOSIS — E66811 Obesity, class 1: Secondary | ICD-10-CM | POA: Diagnosis not present

## 2023-11-18 DIAGNOSIS — E782 Mixed hyperlipidemia: Secondary | ICD-10-CM | POA: Diagnosis not present

## 2023-11-18 DIAGNOSIS — R7301 Impaired fasting glucose: Secondary | ICD-10-CM

## 2023-11-18 DIAGNOSIS — F900 Attention-deficit hyperactivity disorder, predominantly inattentive type: Secondary | ICD-10-CM | POA: Diagnosis not present

## 2023-11-18 DIAGNOSIS — F339 Major depressive disorder, recurrent, unspecified: Secondary | ICD-10-CM | POA: Diagnosis not present

## 2023-11-18 LAB — MICROALBUMIN / CREATININE URINE RATIO
Creatinine,U: 220.3 mg/dL
Microalb Creat Ratio: 9.7 mg/g (ref 0.0–30.0)
Microalb, Ur: 2.1 mg/dL — ABNORMAL HIGH (ref 0.0–1.9)

## 2023-11-18 MED ORDER — AMPHETAMINE-DEXTROAMPHETAMINE 7.5 MG PO TABS
7.5000 mg | ORAL_TABLET | Freq: Every day | ORAL | 0 refills | Status: DC
  Filled 2023-11-18: qty 30, 30d supply, fill #0

## 2023-11-18 MED ORDER — BUPROPION HCL ER (XL) 150 MG PO TB24
150.0000 mg | ORAL_TABLET | Freq: Every day | ORAL | 2 refills | Status: DC
  Filled 2023-11-18: qty 30, 30d supply, fill #0
  Filled 2023-12-15: qty 30, 30d supply, fill #1
  Filled 2024-01-16: qty 30, 30d supply, fill #2

## 2023-11-18 MED ORDER — LOSARTAN POTASSIUM 50 MG PO TABS
50.0000 mg | ORAL_TABLET | Freq: Every day | ORAL | 1 refills | Status: DC
  Filled 2023-11-18 – 2024-05-24 (×2): qty 90, 90d supply, fill #0

## 2023-11-18 MED ORDER — AMPHETAMINE-DEXTROAMPHET ER 10 MG PO CP24
10.0000 mg | ORAL_CAPSULE | Freq: Every day | ORAL | 0 refills | Status: DC
  Filled 2023-11-18: qty 30, 30d supply, fill #0

## 2023-11-18 MED ORDER — METOPROLOL SUCCINATE ER 50 MG PO TB24
50.0000 mg | ORAL_TABLET | Freq: Every day | ORAL | 1 refills | Status: AC
  Filled 2023-11-18 – 2024-05-24 (×2): qty 90, 90d supply, fill #0

## 2023-11-18 NOTE — Assessment & Plan Note (Signed)
 Adding wellbutrin xl 150 mg daily  for management of  anhedonia, lack of motivation and weight gai

## 2023-11-18 NOTE — Assessment & Plan Note (Signed)
 His concentration deficit is managed with Adderall XR 10 mg daily with  7.5   mg of short acting adderall as needed for weekend or evening work .

## 2023-11-18 NOTE — Progress Notes (Signed)
 Specialty Pharmacy Refill Coordination Note  Christopher Terrell [place] is a 38 y.o. male contacted today regarding refills of specialty medication(s) Dupilumab (Dupixent)   Patient requested Delivery   Delivery date: 11/19/23   Verified address: [address]   [place] [address]   Medication will be filled on 11/18/23.

## 2023-11-18 NOTE — Assessment & Plan Note (Signed)
 He has been drinking alcohol in excess over the holidays and anticipates that his LFTS will be elevated.  He will need an ultrasound repeated if LFTs are elevated

## 2023-11-18 NOTE — Assessment & Plan Note (Signed)
 He has regained weigh I have encouraged him to resume  exercise a minimum of 5 days per week.

## 2023-11-18 NOTE — Progress Notes (Signed)
 Subjective:  Patient ID: Christopher Terrell [place], male    DOB: Jan 08, 1986  Age: 38 y.o. MRN: [id number]  CC: The primary encounter diagnosis was Primary hypertension. Diagnoses of Obesity (BMI 30.0-34.9), Elevated triglycerides with high cholesterol, Impaired fasting glucose, Long-term use of high-risk medication, Plaque psoriasis, Attention deficit hyperactivity disorder (ADHD), predominantly inattentive type, Major depressive disorder, recurrent episode with melancholic features (HCC), and Metabolic dysfunction-associated steatohepatitis (MASH) were also pertinent to this visit.   HPI Christopher Terrell [place] presents for  Chief Complaint  Patient presents with   Medical Management of Chronic Issues    6 month follow up    1) ADD ;  TAKING ADDERALL   2) HYPERTENSION  3) OBESITY/FATTY LIVER:  HAS REGAINED THE 14 LBS bc he stopped working out in November   4) PSORIASIS ACTING P SINCE WORKING IN THE YARD. DUPIXENT DOSES EVERY OTHER WEDNESDay   5) POSITIVE DEPRESSION SCREEN   6) HAD COVID IN SEPTEMBER.  LASTED 3 DAYS   Outpatient Medications Prior to Visit  Medication Sig Dispense Refill   chlorhexidine (PERIDEX) 0.12 % solution Rinse with 15 ml  2 (two) times daily for 30 seconds then expectorate. Do not eat/drink/rise for 30 minutes. 473 mL 3   clobetasol ointment (TEMOVATE) 0.05 % Apply to affected areas twice daily until smooth, then stop and use as needed. 45 g 1   clonazePAM (KLONOPIN) 0.25 MG disintegrating tablet Take 1 tablet (0.25 mg total) by mouth daily as needed. 30 tablet 2   dupilumab (DUPIXENT) 300 MG/2ML prefilled syringe Inject 300 mg syringe subcutaneously every other week. 4 mL 11   Sodium Fluoride 1.1 % PSTE Brush with pea sized amount for 2 minutes twice a day. Spit, do not swallow. Do not eat, drink or rinse mouth for 30 mins 100 mL 3   traZODone (DESYREL) 50 MG tablet Take 1 tablet (50 mg total) by mouth at bedtime. 90 tablet 1   amphetamine-dextroamphetamine (ADDERALL XR)  10 MG 24 hr capsule Take 1 capsule (10 mg total) by mouth daily. 30 capsule 0   amphetamine-dextroamphetamine (ADDERALL) 7.5 MG tablet Take 1 tablet by mouth daily in the afternoon. 30 tablet 0   losartan (COZAAR) 50 MG tablet Take 1 tablet (50 mg total) by mouth at bedtime. 90 tablet 1   metoprolol succinate (TOPROL-XL) 50 MG 24 hr tablet Take 1 tablet (50 mg total) by mouth daily. Take with or immediately following a meal. 90 tablet 1   dupilumab (DUPIXENT) 300 MG/2ML prefilled syringe Inject 300 mg syringe SQ every other week (Patient not taking: Reported on 11/18/2023) 4 mL 11   fluticasone (FLONASE) 50 MCG/ACT nasal spray Place 2 sprays into both nostrils daily. (Patient not taking: Reported on 11/18/2023) 16 g 4   montelukast (SINGULAIR) 10 MG tablet Take 1 tablet (10 mg total) by mouth at bedtime. (Patient not taking: Reported on 11/18/2023) 90 tablet 1   tobramycin-dexamethasone (TOBRADEX) ophthalmic solution Place 1-2 drops into both eyes every 6 (six) hours as needed. (Patient not taking: Reported on 11/18/2023) 5 mL 1   No facility-administered medications prior to visit.    Review of Systems;  Patient denies headache, fevers, malaise, unintentional weight loss, skin rash, eye pain, sinus congestion and sinus pain, sore throat, dysphagia,  hemoptysis , cough, dyspnea, wheezing, chest pain, palpitations, orthopnea, edema, abdominal pain, nausea, melena, diarrhea, constipation, flank pain, dysuria, hematuria, urinary  Frequency, nocturia, numbness, tingling, seizures,  Focal weakness, Loss of consciousness,  Tremor, insomnia, depression, anxiety, and suicidal  ideation.      Objective:  BP 120/74   Pulse (!) 105   Ht 5\' 6"  (1.676 m)   Wt 233 lb 12.8 oz (106.1 kg)   SpO2 96%   BMI 37.74 kg/m   BP Readings from Last 3 Encounters:  11/18/23 120/74  05/19/23 126/84  09/18/22 128/80    Wt Readings from Last 3 Encounters:  11/18/23 233 lb 12.8 oz (106.1 kg)  05/19/23 219 lb 6.4 oz  (99.5 kg)  09/18/22 226 lb 6.4 oz (102.7 kg)    Physical Exam Vitals reviewed.  Constitutional:      General: He is not in acute distress.    Appearance: Normal appearance. He is normal weight. He is not ill-appearing, toxic-appearing or diaphoretic.  HENT:     Head: Normocephalic.  Eyes:     General: No scleral icterus.       Right eye: No discharge.        Left eye: No discharge.     Conjunctiva/sclera: Conjunctivae normal.  Cardiovascular:     Rate and Rhythm: Normal rate and regular rhythm.     Heart sounds: Normal heart sounds.  Pulmonary:     Effort: Pulmonary effort is normal. No respiratory distress.     Breath sounds: Normal breath sounds.  Musculoskeletal:        General: Normal range of motion.     Cervical back: Normal range of motion.  Skin:    General: Skin is warm and dry.     Findings: Rash present. Rash is papular and scaling.       Neurological:     General: No focal deficit present.     Mental Status: He is alert and oriented to person, place, and time. Mental status is at baseline.  Psychiatric:        Mood and Affect: Mood normal.        Behavior: Behavior normal.        Thought Content: Thought content normal.        Judgment: Judgment normal.   Lab Results  Component Value Date   HGBA1C 5.5 09/29/2022   HGBA1C 5.5 01/07/2021    Lab Results  Component Value Date   CREATININE 0.93 05/25/2023   CREATININE 0.94 09/29/2022   CREATININE 0.91 01/07/2021    Lab Results  Component Value Date   WBC 10.8 (H) 05/25/2023   HGB 13.5 05/25/2023   HCT 38.8 (L) 05/25/2023   PLT 264 05/25/2023   GLUCOSE 92 05/25/2023   CHOL 213 (H) 05/25/2023   TRIG 207 (H) 05/25/2023   HDL 39 (L) 05/25/2023   LDLDIRECT 150 (H) 05/25/2023   LDLCALC 133 (H) 05/25/2023   ALT 82 (H) 06/15/2023   AST 42 (H) 06/15/2023   NA 138 05/25/2023   K 3.9 05/25/2023   CL 102 05/25/2023   CREATININE 0.93 05/25/2023   BUN 18 05/25/2023   CO2 24 05/25/2023   TSH 3.332  09/29/2022   HGBA1C 5.5 09/29/2022   MICROALBUR 5.8 (H) 09/29/2022    No results found.  Assessment & Plan:  .Primary hypertension -     Comprehensive metabolic panel; Future -     Microalbumin / creatinine urine ratio; Future  Obesity (BMI 30.0-34.9) Assessment & Plan: He has regained weigh I have encouraged him to resume  exercise a minimum of 5 days per week.     Elevated triglycerides with high cholesterol Assessment & Plan: Improved with diet and exercise.  Due for repeat  Orders: -     LDL cholesterol, direct; Future -     Lipid panel; Future  Impaired fasting glucose -     Comprehensive metabolic panel; Future -     Hemoglobin A1c; Future  Long-term use of high-risk medication -     CBC with Differential/Platelet; Future -     TSH; Future  Plaque psoriasis Assessment & Plan: Managed with  biweekly dupixent  Per dermatologist Dr Adolphus Birchwood    Attention deficit hyperactivity disorder (ADHD), predominantly inattentive type Assessment & Plan: His concentration deficit is managed with Adderall XR 10 mg daily with  7.5   mg of short acting adderall as needed for weekend or evening work .    Major depressive disorder, recurrent episode with melancholic features (HCC) Assessment & Plan: Adding wellbutrin xl 150 mg daily  for management of  anhedonia, lack of motivation and weight gai   Metabolic dysfunction-associated steatohepatitis (MASH) Assessment & Plan: He has been drinking alcohol in excess over the holidays and anticipates that his LFTS will be elevated.  He will need an ultrasound repeated if LFTs are elevated   Other orders -     Losartan Potassium; Take 1 tablet (50 mg total) by mouth at bedtime.  Dispense: 90 tablet; Refill: 1 -     Metoprolol Succinate ER; Take 1 tablet (50 mg total) by mouth daily. Take with or immediately following a meal.  Dispense: 90 tablet; Refill: 1 -     buPROPion HCl ER (XL); Take 1 tablet (150 mg total) by mouth daily.   Dispense: 30 tablet; Refill: 2 -     Amphetamine-Dextroamphetamine; Take 1 tablet by mouth daily in the afternoon.  Dispense: 30 tablet; Refill: 0 -     Amphetamine-Dextroamphet ER; Take 1 capsule (10 mg total) by mouth daily.  Dispense: 30 capsule; Refill: 0 -     Amphetamine-Dextroamphet ER; Take 1 capsule (10 mg total) by mouth daily.  Dispense: 30 capsule; Refill: 0 -     Amphetamine-Dextroamphetamine; Take 1 tablet by mouth daily in the afternoon.  Dispense: 30 tablet; Refill: 0     I spent 34 minutes on the day of this face to face encounter reviewing patient's  most recent visit with dermatology,   prior relevant surgical and non surgical procedures, previous  labs and imaging studies, counseling on management of fatty liver,  alcohol use and weight management,  reviewing the assessment and plan with patient, and post visit ordering and reviewing of  diagnostics and therapeutics with patient  .   Follow-up: Return in about 6 months (around 05/17/2024).   Sherlene Shams, MD

## 2023-11-18 NOTE — Assessment & Plan Note (Signed)
 Improved with diet and exercise.  Due for repeat

## 2023-11-18 NOTE — Assessment & Plan Note (Signed)
 Managed with  biweekly dupixent  Per dermatologist Dr Adolphus Birchwood

## 2023-11-18 NOTE — Patient Instructions (Addendum)
 I recommend a trial of wellbutrin once daily   It should be taken  in the morning preferably to avoid affecting your sleep If you do not tolerate the dose on an empty stomach,  take it with lunch .  We can change it to a shorter acting version if taking it at lunch still interferes with your sleep

## 2023-11-19 ENCOUNTER — Other Ambulatory Visit (INDEPENDENT_AMBULATORY_CARE_PROVIDER_SITE_OTHER): Payer: Commercial Managed Care - PPO

## 2023-11-19 DIAGNOSIS — I10 Essential (primary) hypertension: Secondary | ICD-10-CM

## 2023-11-19 DIAGNOSIS — E782 Mixed hyperlipidemia: Secondary | ICD-10-CM

## 2023-11-19 DIAGNOSIS — Z79899 Other long term (current) drug therapy: Secondary | ICD-10-CM | POA: Diagnosis not present

## 2023-11-19 DIAGNOSIS — K7581 Nonalcoholic steatohepatitis (NASH): Secondary | ICD-10-CM

## 2023-11-19 DIAGNOSIS — R7301 Impaired fasting glucose: Secondary | ICD-10-CM

## 2023-11-19 NOTE — Addendum Note (Signed)
 Addended by: Warden Fillers on: 11/19/2023 03:07 PM   Modules accepted: Orders

## 2023-11-19 NOTE — Addendum Note (Signed)
 Addended by: Warden Fillers on: 11/19/2023 03:08 PM   Modules accepted: Orders

## 2023-11-20 ENCOUNTER — Encounter: Payer: Self-pay | Admitting: Internal Medicine

## 2023-11-20 LAB — CBC WITH DIFFERENTIAL/PLATELET
Basophils Absolute: 0.1 10*3/uL (ref 0.0–0.2)
Basos: 1 %
EOS (ABSOLUTE): 0.1 10*3/uL (ref 0.0–0.4)
Eos: 1 %
Hematocrit: 41.5 % (ref 37.5–51.0)
Hemoglobin: 14.3 g/dL (ref 13.0–17.7)
Immature Grans (Abs): 0 10*3/uL (ref 0.0–0.1)
Immature Granulocytes: 0 %
Lymphocytes Absolute: 2 10*3/uL (ref 0.7–3.1)
Lymphs: 24 %
MCH: 31.4 pg (ref 26.6–33.0)
MCHC: 34.5 g/dL (ref 31.5–35.7)
MCV: 91 fL (ref 79–97)
Monocytes Absolute: 1 10*3/uL — ABNORMAL HIGH (ref 0.1–0.9)
Monocytes: 11 %
Neutrophils Absolute: 5.2 10*3/uL (ref 1.4–7.0)
Neutrophils: 63 %
Platelets: 282 10*3/uL (ref 150–450)
RBC: 4.55 x10E6/uL (ref 4.14–5.80)
RDW: 12.6 % (ref 11.6–15.4)
WBC: 8.4 10*3/uL (ref 3.4–10.8)

## 2023-11-20 LAB — COMPREHENSIVE METABOLIC PANEL
ALT: 96 IU/L — ABNORMAL HIGH (ref 0–44)
AST: 96 IU/L — ABNORMAL HIGH (ref 0–40)
Albumin: 4.6 g/dL (ref 4.1–5.1)
Alkaline Phosphatase: 79 IU/L (ref 44–121)
BUN/Creatinine Ratio: 11 (ref 9–20)
BUN: 10 mg/dL (ref 6–20)
Bilirubin Total: 0.3 mg/dL (ref 0.0–1.2)
CO2: 23 mmol/L (ref 20–29)
Calcium: 9.7 mg/dL (ref 8.7–10.2)
Chloride: 102 mmol/L (ref 96–106)
Creatinine, Ser: 0.9 mg/dL (ref 0.76–1.27)
Globulin, Total: 2.5 g/dL (ref 1.5–4.5)
Glucose: 86 mg/dL (ref 70–99)
Potassium: 4.1 mmol/L (ref 3.5–5.2)
Sodium: 140 mmol/L (ref 134–144)
Total Protein: 7.1 g/dL (ref 6.0–8.5)
eGFR: 111 mL/min/{1.73_m2} (ref >59)

## 2023-11-20 LAB — LIPID PANEL
Chol/HDL Ratio: 5.1 ratio — ABNORMAL HIGH (ref 0.0–5.0)
Cholesterol, Total: 220 mg/dL — ABNORMAL HIGH (ref 100–199)
HDL: 43 mg/dL (ref >39)
LDL Chol Calc (NIH): 130 mg/dL — ABNORMAL HIGH (ref 0–99)
Triglycerides: 263 mg/dL — ABNORMAL HIGH (ref 0–149)
VLDL Cholesterol Cal: 47 mg/dL — ABNORMAL HIGH (ref 5–40)

## 2023-11-20 LAB — TSH: TSH: 3.88 u[IU]/mL (ref 0.450–4.500)

## 2023-11-20 LAB — LDL CHOLESTEROL, DIRECT: LDL Direct: 143 mg/dL — ABNORMAL HIGH (ref 0–99)

## 2023-11-20 LAB — HEPATIC FUNCTION PANEL: Bilirubin, Direct: 0.12 mg/dL (ref 0.00–0.40)

## 2023-11-20 LAB — HEMOGLOBIN A1C
Est. average glucose Bld gHb Est-mCnc: 114 mg/dL
Hgb A1c MFr Bld: 5.6 % (ref 4.8–5.6)

## 2023-11-21 ENCOUNTER — Encounter: Payer: Self-pay | Admitting: Internal Medicine

## 2023-11-21 ENCOUNTER — Other Ambulatory Visit: Payer: Self-pay | Admitting: Internal Medicine

## 2023-11-21 DIAGNOSIS — K7581 Nonalcoholic steatohepatitis (NASH): Secondary | ICD-10-CM

## 2023-11-22 ENCOUNTER — Telehealth: Payer: Self-pay | Admitting: Internal Medicine

## 2023-11-22 NOTE — Telephone Encounter (Signed)
 Lft pt vm to call ofc to sch [place]. thanks ?

## 2023-11-23 ENCOUNTER — Telehealth: Payer: Self-pay | Admitting: Internal Medicine

## 2023-11-23 NOTE — Telephone Encounter (Signed)
 Lft pt vm to call ofc to sch [place]. thanks ?

## 2023-11-24 ENCOUNTER — Telehealth: Payer: Self-pay | Admitting: Internal Medicine

## 2023-11-24 ENCOUNTER — Other Ambulatory Visit (HOSPITAL_COMMUNITY): Payer: Self-pay

## 2023-11-24 NOTE — Telephone Encounter (Signed)
 Lft pt vm to call ofc to sch [place]. thanks ?

## 2023-12-05 ENCOUNTER — Encounter: Payer: Self-pay | Admitting: Internal Medicine

## 2023-12-10 ENCOUNTER — Other Ambulatory Visit: Payer: Self-pay

## 2023-12-10 NOTE — Progress Notes (Signed)
 Specialty Pharmacy Refill Coordination Note  Christopher Terrell [place] is a 38 y.o. male contacted today regarding refills of specialty medication(s) Dupilumab (Dupixent)   Patient requested Delivery   Delivery date: 12/14/23   Verified address: [address]   [place] [address]   Medication will be filled on 12/13/23.

## 2023-12-13 ENCOUNTER — Other Ambulatory Visit: Payer: Self-pay

## 2023-12-14 ENCOUNTER — Ambulatory Visit
Admission: RE | Admit: 2023-12-14 | Discharge: 2023-12-14 | Disposition: A | Discharge status: Home / Self Care | Source: Ambulatory Visit | Attending: Internal Medicine | Admitting: Internal Medicine

## 2023-12-14 DIAGNOSIS — K7581 Nonalcoholic steatohepatitis (NASH): Secondary | ICD-10-CM | POA: Insufficient documentation

## 2023-12-14 DIAGNOSIS — R932 Abnormal findings on diagnostic imaging of liver and biliary tract: Secondary | ICD-10-CM | POA: Diagnosis not present

## 2023-12-15 ENCOUNTER — Encounter: Payer: Self-pay | Admitting: Internal Medicine

## 2023-12-15 ENCOUNTER — Other Ambulatory Visit: Payer: Self-pay

## 2023-12-21 ENCOUNTER — Other Ambulatory Visit: Payer: Self-pay

## 2023-12-21 MED ORDER — EPINEPHRINE 0.3 MG/0.3ML IJ SOAJ
0.3000 mg | INTRAMUSCULAR | 2 refills | Status: AC
  Filled 2023-12-21: qty 2, 30d supply, fill #0
  Filled 2024-01-16: qty 2, 30d supply, fill #1

## 2024-01-05 ENCOUNTER — Other Ambulatory Visit: Payer: Self-pay

## 2024-01-05 ENCOUNTER — Other Ambulatory Visit (HOSPITAL_COMMUNITY): Payer: Self-pay

## 2024-01-05 NOTE — Progress Notes (Signed)
 Specialty Pharmacy Refill Coordination Note  Mohamad Bruso [place] is a 38 y.o. male contacted today regarding refills of specialty medication(s) Dupixent.  Patient requested (Patient-Rptd) Delivery   Delivery date: (Patient-Rptd) 01/07/24   Verified address: (Patient-Rptd) [address] [place], [place] [address]   Medication will be filled on 01/06/24.

## 2024-01-06 ENCOUNTER — Other Ambulatory Visit: Payer: Self-pay

## 2024-01-16 ENCOUNTER — Other Ambulatory Visit: Payer: Self-pay

## 2024-02-07 ENCOUNTER — Other Ambulatory Visit: Payer: Self-pay

## 2024-02-23 ENCOUNTER — Other Ambulatory Visit: Payer: Self-pay | Admitting: Internal Medicine

## 2024-02-24 ENCOUNTER — Other Ambulatory Visit: Payer: Self-pay

## 2024-02-24 MED ORDER — BUPROPION HCL ER (XL) 150 MG PO TB24
150.0000 mg | ORAL_TABLET | Freq: Every day | ORAL | 2 refills | Status: DC
  Filled 2024-02-24: qty 30, 30d supply, fill #0
  Filled 2024-03-29: qty 30, 30d supply, fill #1
  Filled 2024-04-17 – 2024-04-20 (×3): qty 30, 30d supply, fill #2

## 2024-03-20 ENCOUNTER — Other Ambulatory Visit: Payer: Self-pay | Admitting: Pharmacy Technician

## 2024-03-20 ENCOUNTER — Other Ambulatory Visit: Payer: Self-pay

## 2024-03-20 NOTE — Progress Notes (Signed)
 Specialty Pharmacy Refill Coordination Note  Thelonious Kauffmann [place] is a 38 y.o. male contacted today regarding refills of specialty medication(s) Dupilumab  (Dupixent )   Patient requested Delivery   Delivery date: 03/21/24   Verified address: [address] [place]   Medication will be filled on 03/20/24.

## 2024-03-23 DIAGNOSIS — L2089 Other atopic dermatitis: Secondary | ICD-10-CM | POA: Diagnosis not present

## 2024-03-23 DIAGNOSIS — L400 Psoriasis vulgaris: Secondary | ICD-10-CM | POA: Diagnosis not present

## 2024-03-28 ENCOUNTER — Other Ambulatory Visit: Payer: Self-pay

## 2024-03-29 ENCOUNTER — Other Ambulatory Visit: Payer: Self-pay

## 2024-04-04 DIAGNOSIS — H43812 Vitreous degeneration, left eye: Secondary | ICD-10-CM | POA: Diagnosis not present

## 2024-04-04 DIAGNOSIS — H5213 Myopia, bilateral: Secondary | ICD-10-CM | POA: Diagnosis not present

## 2024-04-10 ENCOUNTER — Other Ambulatory Visit: Payer: Self-pay

## 2024-04-12 ENCOUNTER — Other Ambulatory Visit: Payer: Self-pay

## 2024-04-12 ENCOUNTER — Encounter (INDEPENDENT_AMBULATORY_CARE_PROVIDER_SITE_OTHER): Payer: Self-pay

## 2024-04-13 ENCOUNTER — Other Ambulatory Visit: Payer: Self-pay

## 2024-04-13 NOTE — Progress Notes (Signed)
 PA pending

## 2024-04-14 ENCOUNTER — Other Ambulatory Visit: Payer: Self-pay

## 2024-04-17 ENCOUNTER — Other Ambulatory Visit: Payer: Self-pay

## 2024-04-17 ENCOUNTER — Other Ambulatory Visit (HOSPITAL_COMMUNITY): Payer: Self-pay

## 2024-04-18 ENCOUNTER — Other Ambulatory Visit: Payer: Self-pay

## 2024-04-18 ENCOUNTER — Other Ambulatory Visit (HOSPITAL_COMMUNITY): Payer: Self-pay

## 2024-04-18 NOTE — Progress Notes (Signed)
 Specialty Pharmacy Refill Coordination Note  MyChart Questionnaire Submission  Christopher Terrell [place] is a 38 y.o. male contacted today regarding refills of specialty medication(s) Dupixent .  Doses on hand: 0   Injection date: 04/19/24  Patient requested: Delivery   Delivery date: 04/19/24  Verified address: [address] [place] [address]   Medication will be filled on 04/18/24.  This fill date is pending response to refill request from provider. Patient is aware and if they have not received fill by intended date, they must follow up with pharmacy.

## 2024-04-18 NOTE — Progress Notes (Signed)
 PA submitted to plan by provider yesterday. Status is pending

## 2024-04-19 ENCOUNTER — Other Ambulatory Visit (HOSPITAL_COMMUNITY): Payer: Self-pay

## 2024-04-19 ENCOUNTER — Other Ambulatory Visit: Payer: Self-pay

## 2024-04-19 NOTE — Progress Notes (Signed)
 Office completed PA. Approved until 04/18/25

## 2024-04-19 NOTE — Progress Notes (Signed)
 PA has been Approved. Left voicemail for patient with new shipping date.

## 2024-04-20 ENCOUNTER — Other Ambulatory Visit: Payer: Self-pay

## 2024-05-05 ENCOUNTER — Other Ambulatory Visit (HOSPITAL_COMMUNITY): Payer: Self-pay

## 2024-05-08 ENCOUNTER — Other Ambulatory Visit (HOSPITAL_COMMUNITY): Payer: Self-pay

## 2024-05-09 ENCOUNTER — Other Ambulatory Visit: Payer: Self-pay

## 2024-05-09 MED ORDER — OSELTAMIVIR PHOSPHATE 75 MG PO CAPS
75.0000 mg | ORAL_CAPSULE | Freq: Two times a day (BID) | ORAL | 1 refills | Status: DC
  Filled 2024-05-09: qty 10, 5d supply, fill #0

## 2024-05-10 ENCOUNTER — Other Ambulatory Visit (HOSPITAL_COMMUNITY): Payer: Self-pay

## 2024-05-12 ENCOUNTER — Other Ambulatory Visit: Payer: Self-pay

## 2024-05-12 ENCOUNTER — Other Ambulatory Visit (HOSPITAL_COMMUNITY): Payer: Self-pay

## 2024-05-12 NOTE — Progress Notes (Signed)
 Specialty Pharmacy Refill Coordination Note  Spoke with Kmarion Rawl [place]  Tysheem W [place] is a 38 y.o. male contacted today regarding refills of specialty medication(s) Dupilumab  (Dupixent )  Doses on hand: 0  Injection date: 05/17/24   Patient requested: Delivery   Delivery date: 05/16/24   Verified address: [address] [place] [address]  Medication will be filled on 05/15/24.

## 2024-05-15 ENCOUNTER — Other Ambulatory Visit: Payer: Self-pay

## 2024-05-24 ENCOUNTER — Other Ambulatory Visit: Payer: Self-pay | Admitting: Internal Medicine

## 2024-05-25 ENCOUNTER — Other Ambulatory Visit: Payer: Self-pay

## 2024-05-25 MED ORDER — BUPROPION HCL ER (XL) 150 MG PO TB24
150.0000 mg | ORAL_TABLET | Freq: Every day | ORAL | 0 refills | Status: DC
Start: 2024-05-25 — End: 2024-07-13
  Filled 2024-05-25: qty 30, 30d supply, fill #0

## 2024-05-25 MED ORDER — CLONAZEPAM 0.25 MG PO TBDP
0.2500 mg | ORAL_TABLET | Freq: Every day | ORAL | 0 refills | Status: DC | PRN
  Filled 2024-05-25: qty 30, 30d supply, fill #0

## 2024-05-25 NOTE — Telephone Encounter (Signed)
 Refilled: 06/15/2023 Last OV: 11/18/2023 Next OV: not scheduled   LMTCB. Need to schedule pt for a 6 month follow up.

## 2024-06-07 ENCOUNTER — Other Ambulatory Visit (HOSPITAL_COMMUNITY): Payer: Self-pay

## 2024-06-09 ENCOUNTER — Other Ambulatory Visit: Payer: Self-pay

## 2024-06-09 ENCOUNTER — Other Ambulatory Visit: Payer: Self-pay | Admitting: Pharmacy Technician

## 2024-06-09 NOTE — Progress Notes (Signed)
 Specialty Pharmacy Refill Coordination Note  Christopher Terrell [place] is a 39 y.o. male contacted today regarding refills of specialty medication(s) Dupilumab  (Dupixent )   Patient requested Delivery   Delivery date: 06/21/24   Verified address: [address]  [place]    Medication will be filled on 06/20/24.

## 2024-06-13 ENCOUNTER — Other Ambulatory Visit: Payer: Self-pay

## 2024-06-13 NOTE — Progress Notes (Signed)
 Specialty Pharmacy Ongoing Clinical Assessment Note  Tafari W [place] is a 38 y.o. male who is being followed by the specialty pharmacy service for RxSp Atopic Dermatitis   Patient's specialty medication(s) reviewed today: Dupilumab  (Dupixent )   Missed doses in the last 4 weeks: 0   Patient/Caregiver did not have any additional questions or concerns.   Therapeutic benefit summary: Patient is achieving benefit   Adverse events/side effects summary: No adverse events/side effects   Patient's therapy is appropriate to: Continue    Goals Addressed             This Visit's Progress    Maintain optimal adherence to therapy   On track    Patient is on track. Patient will maintain adherence         Follow up: 12 months  Cannen Dupras M [place] Specialty Pharmacist

## 2024-06-20 ENCOUNTER — Other Ambulatory Visit: Payer: Self-pay

## 2024-07-13 ENCOUNTER — Other Ambulatory Visit: Payer: Self-pay | Admitting: Internal Medicine

## 2024-07-13 ENCOUNTER — Other Ambulatory Visit: Payer: Self-pay

## 2024-07-13 NOTE — Progress Notes (Signed)
 Specialty Pharmacy Refill Coordination Note  Alik Mawson [place] is a 38 y.o. male contacted today regarding refills of specialty medication(s) Dupilumab  (Dupixent )   Patient requested Delivery   Delivery date: 07/19/24   Verified address: [address]  [place]   Medication will be filled on 07/18/24.

## 2024-07-14 ENCOUNTER — Other Ambulatory Visit: Payer: Self-pay

## 2024-07-14 MED ORDER — BUPROPION HCL ER (XL) 150 MG PO TB24
150.0000 mg | ORAL_TABLET | Freq: Every day | ORAL | 0 refills | Status: DC
  Filled 2024-07-14: qty 30, 30d supply, fill #0

## 2024-07-18 ENCOUNTER — Other Ambulatory Visit: Payer: Self-pay

## 2024-08-08 ENCOUNTER — Other Ambulatory Visit (HOSPITAL_COMMUNITY): Payer: Self-pay

## 2024-08-11 ENCOUNTER — Other Ambulatory Visit: Payer: Self-pay | Admitting: Internal Medicine

## 2024-08-11 ENCOUNTER — Other Ambulatory Visit: Payer: Self-pay

## 2024-08-11 MED ORDER — TRAZODONE HCL 50 MG PO TABS
50.0000 mg | ORAL_TABLET | Freq: Every day | ORAL | 1 refills | Status: AC
  Filled 2024-08-11: qty 90, 90d supply, fill #0

## 2024-08-14 ENCOUNTER — Other Ambulatory Visit: Payer: Self-pay

## 2024-08-15 ENCOUNTER — Other Ambulatory Visit: Payer: Self-pay

## 2024-08-16 ENCOUNTER — Other Ambulatory Visit: Payer: Self-pay

## 2024-08-16 NOTE — Progress Notes (Signed)
 Specialty Pharmacy Refill Coordination Note  Christopher Terrell is a 38 y.o. male contacted today regarding refills of specialty medication(s) Dupilumab  (Dupixent )   Patient requested Delivery   Delivery date: 08/22/24   Verified address: [address]  [place]   Medication will be filled on: 08/21/24

## 2024-08-21 ENCOUNTER — Ambulatory Visit: Payer: Self-pay | Admitting: *Deleted

## 2024-08-21 ENCOUNTER — Other Ambulatory Visit: Payer: Self-pay

## 2024-08-21 ENCOUNTER — Other Ambulatory Visit (INDEPENDENT_AMBULATORY_CARE_PROVIDER_SITE_OTHER): Payer: Self-pay | Admitting: Vascular Surgery

## 2024-08-21 ENCOUNTER — Other Ambulatory Visit
Admission: RE | Admit: 2024-08-21 | Discharge: 2024-08-21 | Disposition: A | Source: Ambulatory Visit | Attending: Vascular Surgery | Admitting: Vascular Surgery

## 2024-08-21 DIAGNOSIS — K922 Gastrointestinal hemorrhage, unspecified: Secondary | ICD-10-CM | POA: Diagnosis not present

## 2024-08-21 LAB — CBC
HCT: 15.9 % — ABNORMAL LOW (ref 39.0–52.0)
Hemoglobin: 5.2 g/dL — ABNORMAL LOW (ref 13.0–17.0)
MCH: 32.1 pg (ref 26.0–34.0)
MCHC: 32.7 g/dL (ref 30.0–36.0)
MCV: 98.1 fL (ref 80.0–100.0)
Platelets: 267 K/uL (ref 150–400)
RBC: 1.62 MIL/uL — ABNORMAL LOW (ref 4.22–5.81)
RDW: 16.7 % — ABNORMAL HIGH (ref 11.5–15.5)
WBC: 14 K/uL — ABNORMAL HIGH (ref 4.0–10.5)
nRBC: 2.2 % — ABNORMAL HIGH (ref 0.0–0.2)

## 2024-08-21 NOTE — Telephone Encounter (Signed)
 Pt stated that he will go as soon as he gets things taken care of at home.

## 2024-08-21 NOTE — Telephone Encounter (Signed)
 Patient states he just got a call from Dr Cordella Shawl regarding his Hgb level- 5.2. Patient states he was advised to go to ED for transfusion. Patient wants to know if there is a cheaper option- outpatient rather than ED- it is so expensive  Patient advised to go per his provider order. Patient really wants input from PCP. Patient advised due to the late hour of the call- he may not get response before the end of day and strongly encourage that he go now.

## 2024-08-21 NOTE — Telephone Encounter (Signed)
 FYI Only or Action Required?: Action required by provider: clinical question for provider.  Patient was last seen in primary care on 11/18/2023 by Marylynn Verneita CROME, MD.  Called Nurse Triage reporting Results.  Symptoms began today.  Interventions attempted: Nothing.  Symptoms are: unchanged.  Triage Disposition: Call PCP When Office is Open  Patient/caregiver understands and will follow disposition?: No, wishes to speak with PCP

## 2024-08-21 NOTE — Telephone Encounter (Signed)
 Copied from CRM #[id number]. Topic: Clinical - Red Word Triage >> Aug 21, 2024  3:20 PM Alfonso HERO wrote: Red Word that prompted transfer to Nurse Triage: low hemoglobin Reason for Disposition  [1] Caller requesting NON-URGENT health information AND [2] PCP's office is the best resource  Answer Assessment - Initial Assessment Questions 1. REASON FOR CALL: What is the main reason for your call? or How can I best help you?     Patient states he just got a call from Dr Cordella Shawl regarding his Hgb level- 5.2. Patient states he was advised to go to ED for transfusion. Patient wants to know if there is a cheaper option- outpatient rather than ED- it is so expensive  Patient advised to go per his provider order. Patient really wants input from PCP. Patient advised due to the late hour of the call- he may not get response before the end of day and strongly encourage that he go now.  Protocols used: Information Only Call - No Triage-A-AH

## 2024-08-22 ENCOUNTER — Inpatient Hospital Stay: Admitting: Certified Registered"

## 2024-08-22 ENCOUNTER — Observation Stay
Admission: EM | Admit: 2024-08-22 | Discharge: 2024-08-23 | Disposition: A | Attending: Gastroenterology | Admitting: Gastroenterology

## 2024-08-22 ENCOUNTER — Emergency Department

## 2024-08-22 ENCOUNTER — Encounter: Payer: Self-pay | Admitting: *Deleted

## 2024-08-22 ENCOUNTER — Other Ambulatory Visit (HOSPITAL_COMMUNITY): Payer: Self-pay

## 2024-08-22 ENCOUNTER — Other Ambulatory Visit: Payer: Self-pay

## 2024-08-22 ENCOUNTER — Encounter: Admission: EM | Disposition: A | Payer: Self-pay | Source: Home / Self Care | Attending: Internal Medicine

## 2024-08-22 DIAGNOSIS — F418 Other specified anxiety disorders: Secondary | ICD-10-CM | POA: Diagnosis not present

## 2024-08-22 DIAGNOSIS — F419 Anxiety disorder, unspecified: Secondary | ICD-10-CM | POA: Diagnosis not present

## 2024-08-22 DIAGNOSIS — K259 Gastric ulcer, unspecified as acute or chronic, without hemorrhage or perforation: Secondary | ICD-10-CM | POA: Diagnosis not present

## 2024-08-22 DIAGNOSIS — Z87891 Personal history of nicotine dependence: Secondary | ICD-10-CM | POA: Diagnosis not present

## 2024-08-22 DIAGNOSIS — E66811 Obesity, class 1: Secondary | ICD-10-CM | POA: Diagnosis present

## 2024-08-22 DIAGNOSIS — E669 Obesity, unspecified: Secondary | ICD-10-CM | POA: Insufficient documentation

## 2024-08-22 DIAGNOSIS — K254 Chronic or unspecified gastric ulcer with hemorrhage: Secondary | ICD-10-CM | POA: Diagnosis not present

## 2024-08-22 DIAGNOSIS — K922 Gastrointestinal hemorrhage, unspecified: Secondary | ICD-10-CM | POA: Diagnosis not present

## 2024-08-22 DIAGNOSIS — Z79899 Other long term (current) drug therapy: Secondary | ICD-10-CM | POA: Diagnosis not present

## 2024-08-22 DIAGNOSIS — I10 Essential (primary) hypertension: Secondary | ICD-10-CM | POA: Diagnosis not present

## 2024-08-22 DIAGNOSIS — D649 Anemia, unspecified: Principal | ICD-10-CM

## 2024-08-22 DIAGNOSIS — F32A Depression, unspecified: Secondary | ICD-10-CM | POA: Insufficient documentation

## 2024-08-22 DIAGNOSIS — Z6832 Body mass index (BMI) 32.0-32.9, adult: Secondary | ICD-10-CM | POA: Diagnosis not present

## 2024-08-22 DIAGNOSIS — D509 Iron deficiency anemia, unspecified: Secondary | ICD-10-CM | POA: Insufficient documentation

## 2024-08-22 DIAGNOSIS — Z6830 Body mass index (BMI) 30.0-30.9, adult: Secondary | ICD-10-CM | POA: Diagnosis not present

## 2024-08-22 DIAGNOSIS — D62 Acute posthemorrhagic anemia: Secondary | ICD-10-CM | POA: Insufficient documentation

## 2024-08-22 DIAGNOSIS — K7581 Nonalcoholic steatohepatitis (NASH): Secondary | ICD-10-CM | POA: Diagnosis not present

## 2024-08-22 HISTORY — DX: Essential (primary) hypertension: I10

## 2024-08-22 HISTORY — PX: ESOPHAGOGASTRODUODENOSCOPY: SHX5428

## 2024-08-22 LAB — CBC WITH DIFFERENTIAL/PLATELET
Abs Immature Granulocytes: 0.3 K/uL — ABNORMAL HIGH (ref 0.00–0.07)
Basophils Absolute: 0 K/uL (ref 0.0–0.1)
Basophils Relative: 0 %
Eosinophils Absolute: 0 K/uL (ref 0.0–0.5)
Eosinophils Relative: 0 %
HCT: 15.5 % — ABNORMAL LOW (ref 39.0–52.0)
Hemoglobin: 4.9 g/dL — CL (ref 13.0–17.0)
Immature Granulocytes: 3 %
Lymphocytes Relative: 16 %
Lymphs Abs: 1.8 K/uL (ref 0.7–4.0)
MCH: 31.8 pg (ref 26.0–34.0)
MCHC: 31.6 g/dL (ref 30.0–36.0)
MCV: 100.6 fL — ABNORMAL HIGH (ref 80.0–100.0)
Monocytes Absolute: 1.2 K/uL — ABNORMAL HIGH (ref 0.1–1.0)
Monocytes Relative: 11 %
Neutro Abs: 7.4 K/uL (ref 1.7–7.7)
Neutrophils Relative %: 70 %
Platelets: 251 K/uL (ref 150–400)
RBC: 1.54 MIL/uL — ABNORMAL LOW (ref 4.22–5.81)
RDW: 17.4 % — ABNORMAL HIGH (ref 11.5–15.5)
WBC: 10.7 K/uL — ABNORMAL HIGH (ref 4.0–10.5)
nRBC: 1.4 % — ABNORMAL HIGH (ref 0.0–0.2)

## 2024-08-22 LAB — BASIC METABOLIC PANEL WITH GFR
Anion gap: 10 (ref 5–15)
BUN: 11 mg/dL (ref 6–20)
CO2: 24 mmol/L (ref 22–32)
Calcium: 8.3 mg/dL — ABNORMAL LOW (ref 8.9–10.3)
Chloride: 105 mmol/L (ref 98–111)
Creatinine, Ser: 1.01 mg/dL (ref 0.61–1.24)
GFR, Estimated: >60 mL/min (ref >60)
Glucose, Bld: 106 mg/dL — ABNORMAL HIGH (ref 70–99)
Potassium: 3.7 mmol/L (ref 3.5–5.1)
Sodium: 140 mmol/L (ref 135–145)

## 2024-08-22 LAB — IRON AND TIBC
Iron: 26 ug/dL — ABNORMAL LOW (ref 45–182)
Saturation Ratios: 6 % — ABNORMAL LOW (ref 17.9–39.5)
TIBC: 417 ug/dL (ref 250–450)
UIBC: 391 ug/dL

## 2024-08-22 LAB — TROPONIN T, HIGH SENSITIVITY
Troponin T High Sensitivity: <15 ng/L (ref 0–19)
Troponin T High Sensitivity: <15 ng/L (ref 0–19)

## 2024-08-22 LAB — LACTIC ACID, PLASMA
Lactic Acid, Venous: 1.3 mmol/L (ref 0.5–1.9)
Lactic Acid, Venous: 0.8 mmol/L (ref 0.5–1.9)

## 2024-08-22 LAB — HEMOGLOBIN AND HEMATOCRIT, BLOOD
HCT: 22.3 % — ABNORMAL LOW (ref 39.0–52.0)
HCT: 23.3 % — ABNORMAL LOW (ref 39.0–52.0)
Hemoglobin: 7.4 g/dL — ABNORMAL LOW (ref 13.0–17.0)
Hemoglobin: 7.7 g/dL — ABNORMAL LOW (ref 13.0–17.0)

## 2024-08-22 LAB — FERRITIN: Ferritin: 66 ng/mL (ref 24–336)

## 2024-08-22 LAB — PROTIME-INR
INR: 1 (ref 0.8–1.2)
Prothrombin Time: 14.1 s (ref 11.4–15.2)

## 2024-08-22 LAB — PREPARE RBC (CROSSMATCH)

## 2024-08-22 LAB — ABO/RH: ABO/RH(D): O NEG

## 2024-08-22 LAB — HIV ANTIBODY (ROUTINE TESTING W REFLEX): HIV Screen 4th Generation wRfx: NONREACTIVE

## 2024-08-22 SURGERY — EGD (ESOPHAGOGASTRODUODENOSCOPY)
Anesthesia: General

## 2024-08-22 MED ORDER — MIDAZOLAM HCL 2 MG/2ML IJ SOLN
INTRAMUSCULAR | Status: AC
  Filled 2024-08-22: qty 2

## 2024-08-22 MED ORDER — ONDANSETRON HCL 4 MG/2ML IJ SOLN: 4.0000 mg | Freq: Four times a day (QID) | INTRAMUSCULAR | Status: DC | PRN

## 2024-08-22 MED ORDER — PROPOFOL 10 MG/ML IV BOLUS
INTRAVENOUS | Status: AC
  Filled 2024-08-22: qty 20

## 2024-08-22 MED ORDER — ACETAMINOPHEN 650 MG RE SUPP: 650.0000 mg | Freq: Four times a day (QID) | RECTAL | Status: DC | PRN

## 2024-08-22 MED ORDER — ONDANSETRON HCL 4 MG PO TABS: 4.0000 mg | ORAL_TABLET | Freq: Four times a day (QID) | ORAL | Status: DC | PRN

## 2024-08-22 MED ORDER — GLYCOPYRROLATE 0.2 MG/ML IJ SOLN
INTRAMUSCULAR | Status: DC | PRN
  Administered 2024-08-22: .2 mg via INTRAVENOUS

## 2024-08-22 MED ORDER — CLONAZEPAM 0.25 MG PO TBDP: 0.2500 mg | ORAL_TABLET | Freq: Every day | ORAL | Status: DC | PRN

## 2024-08-22 MED ORDER — AMPHETAMINE-DEXTROAMPHET ER 10 MG PO CP24: 10.0000 mg | ORAL_CAPSULE | Freq: Every day | ORAL | Status: DC

## 2024-08-22 MED ORDER — BUPROPION HCL ER (XL) 150 MG PO TB24
150.0000 mg | ORAL_TABLET | Freq: Every day | ORAL | Status: DC
  Administered 2024-08-22 – 2024-08-23 (×2): 150 mg via ORAL
  Filled 2024-08-22 (×2): qty 1

## 2024-08-22 MED ORDER — ESMOLOL HCL 100 MG/10ML IV SOLN
INTRAVENOUS | Status: DC | PRN
  Administered 2024-08-22: 30 mg via INTRAVENOUS

## 2024-08-22 MED ORDER — LABETALOL HCL 5 MG/ML IV SOLN: 10.0000 mg | INTRAVENOUS | Status: DC | PRN

## 2024-08-22 MED ORDER — TRAZODONE HCL 50 MG PO TABS
50.0000 mg | ORAL_TABLET | Freq: Every day | ORAL | Status: DC
  Administered 2024-08-22: 50 mg via ORAL
  Filled 2024-08-22: qty 1

## 2024-08-22 MED ORDER — PANTOPRAZOLE SODIUM 40 MG IV SOLR
80.0000 mg | Freq: Once | INTRAVENOUS | Status: AC
  Administered 2024-08-22: 80 mg via INTRAVENOUS
  Filled 2024-08-22: qty 20

## 2024-08-22 MED ORDER — ACETAMINOPHEN 325 MG PO TABS: 650.0000 mg | ORAL_TABLET | Freq: Four times a day (QID) | ORAL | Status: DC | PRN

## 2024-08-22 MED ORDER — LIDOCAINE HCL (CARDIAC) PF 100 MG/5ML IV SOSY
PREFILLED_SYRINGE | INTRAVENOUS | Status: DC | PRN
  Administered 2024-08-22: 100 mg via INTRAVENOUS

## 2024-08-22 MED ORDER — PROPOFOL 500 MG/50ML IV EMUL
INTRAVENOUS | Status: DC | PRN
  Administered 2024-08-22: 145 ug/kg/min via INTRAVENOUS

## 2024-08-22 MED ORDER — MIDAZOLAM HCL (PF) 2 MG/2ML IJ SOLN
INTRAMUSCULAR | Status: DC | PRN
  Administered 2024-08-22 (×2): 2 mg via INTRAVENOUS

## 2024-08-22 MED ORDER — IRON SUCROSE 500 MG IVPB - SIMPLE MED
500.0000 mg | Freq: Once | INTRAVENOUS | Status: AC
  Administered 2024-08-23: 500 mg via INTRAVENOUS
  Filled 2024-08-22: qty 500

## 2024-08-22 MED ORDER — DEXMEDETOMIDINE HCL IN NACL 200 MCG/50ML IV SOLN
INTRAVENOUS | Status: DC | PRN
  Administered 2024-08-22: 12 ug via INTRAVENOUS

## 2024-08-22 MED ORDER — PROPOFOL 10 MG/ML IV BOLUS
INTRAVENOUS | Status: DC | PRN
  Administered 2024-08-22: 70 mg via INTRAVENOUS
  Administered 2024-08-22: 30 mg via INTRAVENOUS
  Administered 2024-08-22: 100 mg via INTRAVENOUS

## 2024-08-22 MED ORDER — SODIUM CHLORIDE 0.9 % IV SOLN: INTRAVENOUS | Status: DC

## 2024-08-22 MED ORDER — PANTOPRAZOLE SODIUM 40 MG IV SOLR
40.0000 mg | Freq: Two times a day (BID) | INTRAVENOUS | Status: DC
  Administered 2024-08-22 – 2024-08-23 (×2): 40 mg via INTRAVENOUS
  Filled 2024-08-22 (×2): qty 10

## 2024-08-22 MED ORDER — SODIUM CHLORIDE 0.9 % IV SOLN: 10.0000 mL/h | Freq: Once | INTRAVENOUS | Status: AC

## 2024-08-22 NOTE — ED Triage Notes (Signed)
 Pt reports since last Monday he had been having dark stools, he saw his doctor and he was occult positive. Cbc was done yesterday and they called to tell him hgb was 5.2. SOB since Friday.

## 2024-08-22 NOTE — Transfer of Care (Signed)
 Immediate Anesthesia Transfer of Care Note  Patient: Christopher Terrell  Procedure(s) Performed: EGD (ESOPHAGOGASTRODUODENOSCOPY)  Patient Location: PACU  Anesthesia Type:MAC  Level of Consciousness: responds to stimulation  Airway & Oxygen Therapy: Patient Spontanous Breathing and Patient connected to face mask oxygen  Post-op Assessment: Report given to RN, Post -op Vital signs reviewed and stable, and Patient moving all extremities X 4  Post vital signs: Reviewed and stable  Last Vitals:  Vitals Value Taken Time  BP    Temp    Pulse 111 08/22/24 13:48  Resp 22 08/22/24 13:48  SpO2 100 % 08/22/24 13:48  Vitals shown include unfiled device data.  Last Pain:  Vitals:   08/22/24 1234  TempSrc: Temporal  PainSc: 0-No pain         Complications: No notable events documented.

## 2024-08-22 NOTE — H&P (Signed)
 History and Physical    Christopher Terrell FMW:[id number] DOB: Sep 29, 1985 DOA: 08/22/2024  PCP: Christopher Verneita CROME, MD (Confirm with patient/family/NH records and if not entered, this has to be entered at [facility] point of entry) Patient coming from: Home  I have personally briefly reviewed patient's old medical records in [facility]  Chief Complaint: Black tarry stool, increasing shortness of breath  HPI: Christopher Terrell is a 38 y.o. male with medical history significant of HTN, alcohol use, remote history of GI bleed from a hiatal hernia status post hiatal hernia repair 20 years ago, presented with worsening of black tarry stool and increasing exertional dyspnea.  Patient started noticed stool color turned black 7 days ago, denies any abdominal pain no nauseous vomiting, over the weekend he started to feel increasing exertional dyspnea but denied any chest pain no cough.  He went to see PCP yesterday and PCP sent CBC and did a guaiac test in the office which was positive.  He was instructed to come to ED by PCP yesterday evening.  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  He has been sober since.  ED Course: Tachycardia heart rate 100-1 tens, blood pressure 120/70 O2 saturation 100% room air.  Hemoglobin 4.9, to baseline more than 14 of 10 months ago, BUN 11 creatinine 1.0 glucose 106.  Patient was started on PRBC x 2 in the ED and 1 dose of PPI given.  Review of Systems: As per HPI otherwise 14 point review of systems negative.    Past Medical History:  Diagnosis Date   Hypertension     Past Surgical History:  Procedure Laterality Date   HERNIA REPAIR     SMALL INTESTINE SURGERY       reports that he quit smoking about 12 years ago. His smoking use included cigarettes. He has never used smokeless tobacco. He reports current alcohol use of about 7.0 standard drinks of alcohol per week. He reports that he does  not use drugs.  No Known Allergies  Family History  Problem Relation Age of Onset   Cancer Mother 26       lung ca   Arthritis Mother    Lung cancer Mother    Heart disease Maternal Grandmother    Heart disease Maternal Grandfather    Cancer Paternal Grandfather 39       lymphoma    Prior to Admission medications   Medication Sig Start Date End Date Taking? Authorizing Provider  amphetamine -dextroamphetamine  (ADDERALL  XR) 10 MG 24 hr capsule Take 1 capsule (10 mg total) by mouth daily. 11/18/23   Christopher Verneita CROME, MD  amphetamine -dextroamphetamine  (ADDERALL  XR) 10 MG 24 hr capsule Take 1 capsule (10 mg total) by mouth daily. 11/18/23   Christopher Verneita CROME, MD  amphetamine -dextroamphetamine  (ADDERALL ) 7.5 MG tablet Take 1 tablet by mouth daily in the afternoon. 11/18/23   Christopher Verneita CROME, MD  amphetamine -dextroamphetamine  (ADDERALL ) 7.5 MG tablet Take 1 tablet by mouth daily in the afternoon. 11/18/23   Christopher Verneita CROME, MD  buPROPion  (WELLBUTRIN  XL) 150 MG 24 hr tablet Take 1 tablet (150 mg total) by mouth daily. 07/14/24   Christopher Verneita CROME, MD  chlorhexidine  (PERIDEX ) 0.12 % solution Rinse with 15 ml  2 (two) times daily for 30 seconds then expectorate. Do not eat/drink/rise for 30 minutes. 08/21/22     clobetasol  ointment (TEMOVATE ) 0.05 % Apply to affected areas twice  daily until smooth, then stop and use as needed. 11/11/21     clonazePAM  (KLONOPIN ) 0.25 MG disintegrating tablet Take 1 tablet (0.25 mg total) by mouth daily as needed. 05/25/24   Webb, Padonda B, FNP  dupilumab  (DUPIXENT ) 300 MG/2ML prefilled syringe Inject 300 mg syringe subcutaneously every other week. 09/23/23   Jegede, Olugbemiga E, MD  EPINEPHrine  0.3 mg/0.3 mL IJ SOAJ injection Inject 0.3 mg into the muscle as directed. 12/21/23   Schnier, Cordella MATSU, MD  losartan  (COZAAR ) 50 MG tablet Take 1 tablet (50 mg total) by mouth at bedtime. 11/18/23   Christopher Verneita CROME, MD  metoprolol  succinate (TOPROL -XL) 50 MG 24 hr tablet Take 1 tablet (50  mg total) by mouth daily. Take with or immediately following a meal. 11/18/23   Christopher Verneita CROME, MD  oseltamivir  (TAMIFLU ) 75 MG capsule Take 1 capsule (75 mg total) by mouth 2 (two) times daily for 5 days. 05/09/24     Sodium Fluoride  1.1 % PSTE Brush with pea sized amount for 2 minutes twice a day. Spit, do not swallow. Do not eat, drink or rinse mouth for 30 mins 08/21/21     traZODone  (DESYREL ) 50 MG tablet Take 1 tablet (50 mg total) by mouth at bedtime. 08/11/24   Christopher Verneita CROME, MD    Physical Exam: Vitals:   08/22/24 0630 08/22/24 0730 08/22/24 0750 08/22/24 0805  BP: 114/70 113/74 116/77 118/68  Pulse: (!) 109 (!) 118 (!) 112 (!) 109  Resp: 14 (!) 21 17 18   Temp:   99.3 F (37.4 C) 99.8 F (37.7 C)  TempSrc:   Oral Oral  SpO2: 100% 100% 100% 100%    Constitutional: NAD, calm, comfortable Vitals:   08/22/24 0630 08/22/24 0730 08/22/24 0750 08/22/24 0805  BP: 114/70 113/74 116/77 118/68  Pulse: (!) 109 (!) 118 (!) 112 (!) 109  Resp: 14 (!) 21 17 18   Temp:   99.3 F (37.4 C) 99.8 F (37.7 C)  TempSrc:   Oral Oral  SpO2: 100% 100% 100% 100%   Eyes: PERRL, lids and conjunctivae normal ENMT: Mucous membranes are moist. Posterior pharynx clear of any exudate or lesions.Normal dentition.  Neck: normal, supple, no masses, no thyromegaly Respiratory: clear to auscultation bilaterally, no wheezing, no crackles. Normal respiratory effort. No accessory muscle use.  Cardiovascular: Regular rate and rhythm, no murmurs / rubs / gallops. No extremity edema. 2+ pedal pulses. No carotid bruits.  Abdomen: no tenderness, no masses palpated. No hepatosplenomegaly. Bowel sounds positive.  Musculoskeletal: no clubbing / cyanosis. No joint deformity upper and lower extremities. Good ROM, no contractures. Normal muscle tone.  Skin: no rashes, lesions, ulcers. No induration Neurologic: CN 2-12 grossly intact. Sensation intact, DTR normal. Strength 5/5 in all 4.  Psychiatric: Normal judgment and  insight. Alert and oriented x 3. Normal mood.     Labs on Admission: I have personally reviewed following labs and imaging studies  CBC: Recent Labs  Lab 08/21/24 1008 08/22/24 0508  WBC 14.0* 10.7*  NEUTROABS  --  7.4  HGB 5.2* 4.9*  HCT 15.9* 15.5*  MCV 98.1 100.6*  PLT 267 251   Basic Metabolic Panel: Recent Labs  Lab 08/22/24 0508  NA 140  K 3.7  CL 105  CO2 24  GLUCOSE 106*  BUN 11  CREATININE 1.01  CALCIUM 8.3*   GFR: CrCl cannot be calculated (Unknown ideal weight.). Liver Function Tests: No results for input(s): AST, ALT, ALKPHOS, BILITOT, PROT, ALBUMIN in the last 168 hours.  No results for input(s): LIPASE, AMYLASE in the last 168 hours. No results for input(s): AMMONIA in the last 168 hours. Coagulation Profile: Recent Labs  Lab 08/22/24 0508  INR 1.0   Cardiac Enzymes: No results for input(s): CKTOTAL, CKMB, CKMBINDEX, TROPONINI in the last 168 hours. BNP (last 3 results) No results for input(s): PROBNP in the last 8760 hours. HbA1C: No results for input(s): HGBA1C in the last 72 hours. CBG: No results for input(s): GLUCAP in the last 168 hours. Lipid Profile: No results for input(s): CHOL, HDL, LDLCALC, TRIG, CHOLHDL, LDLDIRECT in the last 72 hours. Thyroid  Function Tests: No results for input(s): TSH, T4TOTAL, FREET4, T3FREE, THYROIDAB in the last 72 hours. Anemia Panel: Recent Labs    08/22/24 0508  FERRITIN 66  TIBC 417  IRON  26*   Urine analysis: No results found for: COLORURINE, APPEARANCEUR, LABSPEC, PHURINE, GLUCOSEU, HGBUR, BILIRUBINUR, KETONESUR, PROTEINUR, UROBILINOGEN, NITRITE, LEUKOCYTESUR  Radiological Exams on Admission: DG Chest 2 View Result Date: 08/22/2024 EXAM: 2 VIEW(S) XRAY OF THE CHEST 08/22/2024 06:21:00 AM COMPARISON: None available. CLINICAL HISTORY: Shortness of breath FINDINGS: LUNGS AND PLEURA: No focal pulmonary opacity. No pleural  effusion. No pneumothorax. HEART AND MEDIASTINUM: Overlapping cardiac leads. No acute abnormality of the cardiac and mediastinal silhouettes. BONES AND SOFT TISSUES: No acute osseous abnormality. IMPRESSION: 1. No acute cardiopulmonary process to explain shortness of breath. Electronically signed by: Waddell Calk MD 08/22/2024 06:29 AM EST RP Workstation: [id number]    EKG: Independently reviewed.  Sinus tachycardia, flattening of T waves on inferior leads, no previous EKG to compare with.  Assessment/Plan Principal Problem:   GI bleed  (please populate well all problems here in Problem List. (For example, if patient is on BP meds at home and you resume or decide to hold them, it is a problem that needs to be her. Same for CAD, COPD, HLD and so on)  Hematochezia Symptomatic anemia secondary to acute blood loss Iron  deficiency anemia - Clinically suspect peptic ulcer, continue PPI.  GI has scheduled emergency EGD this morning. -Repeat H&H this afternoon tonight, consider further transfusion for hemodynamic instability. -IV iron  x 1 scheduled for tomorrow morning, we will go home on p.o. iron  supplement. - Other DDx, normal INR and liver function test, recent RUQ ultrasound in March showed steatosis but no signs of cirrhosis.  Clinically has low suspicion for cirrhosis, we will hold off Sandostatin.  HTN - Hold off home BP meds - Start as needed labetalol   Anxiety/depression -Mentation at baseline - Continue SSRI  DVT prophylaxis: SCD Code Status: Full code Family Communication: None at bedside Disposition Plan: Patient is sick with symptomatic anemia secondary to GI bleed requiring inpatient GI workup as well as blood transfusion, expect more than 2 midnight hospital stay. Consults called: GI Admission status: Telemetry admission   Cort ONEIDA Mana MD [facility] Pager [phone]  08/22/2024, 8:28 AM

## 2024-08-22 NOTE — Plan of Care (Signed)

## 2024-08-22 NOTE — ED Provider Notes (Signed)
 [facility] Provider Note    Event Date/Time   First MD Initiated Contact with Patient 08/22/24 [phone]     (approximate)   History   Shortness of Breath   HPI  Christopher Terrell is a 38 y.o. male with a history of hypertension, psoriasis, ADD, and obesity who presents with anemia.  The patient states that about a week ago he started having dark stools.  He works with a physician and had a stool guaiac test done which was positive.  Subsequently, yesterday, he had a CBC drawn and his hemoglobin was 5.2.  The patient reports shortness of breath with minimal exertion.  He denies any chest pain or palpitations but has noticed that his heart rate is high.  He denies any other abnormal bleeding or bruising.  He has no history of anemia, GI bleed, or prior blood transfusions.  He states that the stools are returning back to normal and have not looked as dark.  He denies any abdominal pain.  I reviewed the past medical records.  The patient's most recent outpatient counter was with internal medicine on 2/27 for follow-up of his chronic conditions.   Physical Exam   Triage Vital Signs: ED Triage Vitals  Encounter Vitals Group     BP 08/22/24 0450 127/73     Girls Systolic BP Percentile --      Girls Diastolic BP Percentile --      Boys Systolic BP Percentile --      Boys Diastolic BP Percentile --      Pulse Rate 08/22/24 0450 (!) 123     Resp 08/22/24 0450 (!) 24     Temp 08/22/24 0450 99.3 F (37.4 C)     Temp Source 08/22/24 0450 Oral     SpO2 08/22/24 0450 100 %     Weight --      Height --      Head Circumference --      Peak Flow --      Pain Score 08/22/24 0447 0     Pain Loc --      Pain Education --      Exclude from Growth Chart --     Most recent vital signs: Vitals:   08/22/24 0450  BP: 127/73  Pulse: (!) 123  Resp: (!) 24  Temp: 99.3 F (37.4 C)  SpO2: 100%     General: Alert, comfortable appearing, no distress.  CV:  Tachycardic,  regular rhythm, good peripheral perfusion.  Resp:  Normal effort.  Abd:  No distention.  Other:  Conjunctival pallor.   ED Results / Procedures / Treatments   Labs (all labs ordered are listed, but only abnormal results are displayed) Labs Reviewed  BASIC METABOLIC PANEL WITH GFR - Abnormal; Notable for the following components:      Result Value   Glucose, Bld 106 (*)    Calcium 8.3 (*)    All other components within normal limits  CBC WITH DIFFERENTIAL/PLATELET - Abnormal; Notable for the following components:   WBC 10.7 (*)    RBC 1.54 (*)    Hemoglobin 4.9 (*)    HCT 15.5 (*)    MCV 100.6 (*)    RDW 17.4 (*)    nRBC 1.4 (*)    Monocytes Absolute 1.2 (*)    Abs Immature Granulocytes 0.30 (*)    All other components within normal limits  IRON AND TIBC - Abnormal; Notable for the following components:   Iron  26 (*)    Saturation Ratios 6 (*)    All other components within normal limits  LACTIC ACID, PLASMA  PROTIME-INR  FERRITIN  LACTIC ACID, PLASMA  TYPE AND SCREEN  PREPARE RBC (CROSSMATCH)  ABO/RH  TROPONIN T, HIGH SENSITIVITY  TROPONIN T, HIGH SENSITIVITY     EKG  ED ECG REPORT I, Waylon Cassis, the attending physician, personally viewed and interpreted this ECG.  Date: 08/22/2024 EKG Time: 0454 Rate: 114 Rhythm: Sinus tachycardia QRS Axis: normal Intervals: normal ST/T Wave abnormalities: Nonspecific T wave abnormalities Narrative Interpretation: no evidence of acute ischemia    RADIOLOGY     PROCEDURES:  Critical Care performed: No  Procedures   MEDICATIONS ORDERED IN ED: Medications  0.9 %  sodium chloride infusion (has no administration in time range)  pantoprazole (PROTONIX) injection 80 mg (has no administration in time range)     IMPRESSION / MDM / ASSESSMENT AND PLAN / ED COURSE  I reviewed the triage vital signs and the nursing notes.  38 year old male with PMH as noted above presents with concern for anemia.  The  patient has had some dark stools and outpatient CBC yesterday showed a hemoglobin of 5.2.  On exam the patient is tachycardic with otherwise normal vital signs.  There are no other concerning acute exam findings.  Differential diagnosis includes, but is not limited to, acute blood loss, iron deficiency anemia, other acute anemia, less likely respiratory etiology.  We will obtain lab workup, chest x-ray, and reassess.  If the hemoglobin is in the same range as on the labs yesterday, the patient likely will need transfusion and admission for further workup and monitoring.  Patient's presentation is most consistent with acute presentation with potential threat to life or bodily function.  ----------------------------------------- 7:15 AM on 08/22/2024 -----------------------------------------  Hemoglobin today is 4.9.  BMP shows no acute findings.  Lactic acid is normal.  Troponin is negative.  Chest x-ray is clear.  The patient's tachycardia has slightly improved.  I have ordered 2 units of PRBCs.  The patient will need inpatient admission for further workup and management.  I have signed him out to the oncoming ED provider.   FINAL CLINICAL IMPRESSION(S) / ED DIAGNOSES   Final diagnoses:  Symptomatic anemia     Rx / DC Orders   ED Discharge Orders     None        Note:  This document was prepared using Dragon voice recognition software and may include unintentional dictation errors.    Cassis Waylon, MD 08/22/24 [phone]

## 2024-08-22 NOTE — ED Notes (Signed)
 Endo states they are going to pick up the patient soon. Patient made aware that he would be leaving for a procedure soon. Helped patient gather his belongings.

## 2024-08-22 NOTE — Op Note (Signed)
 [facility] Gastroenterology Patient Name: Christopher Terrell Procedure Date: 08/22/2024 1:16 PM MRN: [id number] Account #: [id number] Date of Birth: 1986-04-28 Admit Type: Inpatient Age: 38 Room: [facility] ENDO ROOM 4 Gender: Male Note Status: Finalized Instrument Name: Upper GI Scope [id number] Procedure:             Upper GI endoscopy Indications:           Acute post hemorrhagic anemia Providers:             Rogelia Copping MD, MD Medicines:             Propofol per Anesthesia Complications:         No immediate complications. Procedure:             Pre-Anesthesia Assessment:                        - Prior to the procedure, a History and Physical was                         performed, and patient medications and allergies were                         reviewed. The patient's tolerance of previous                         anesthesia was also reviewed. The risks and benefits                         of the procedure and the sedation options and risks                         were discussed with the patient. All questions were                         answered, and informed consent was obtained. Prior                         Anticoagulants: The patient has taken no anticoagulant                         or antiplatelet agents. ASA Grade Assessment: II - A                         patient with mild systemic disease. After reviewing                         the risks and benefits, the patient was deemed in                         satisfactory condition to undergo the procedure.                        After obtaining informed consent, the endoscope was                         passed under direct vision. Throughout the procedure,  the patient's blood pressure, pulse, and oxygen                         saturations were monitored continuously. The Endoscope                         was introduced through the mouth, and advanced to the                         second part  of duodenum. The upper GI endoscopy was                         accomplished without difficulty. The patient tolerated                         the procedure well. Findings:      The examined esophagus was normal.      One non-bleeding cratered gastric ulcer with no stigmata of bleeding was       found in the cardia.      The examined duodenum was normal. Impression:            - Normal esophagus.                        - Non-bleeding gastric ulcer with no stigmata of                         bleeding.                        - Normal examined duodenum.                        - No specimens collected. Recommendation:        - Return patient to hospital ward for ongoing care.                        - Resume regular diet.                        - Continue present medications.                        - Perform a colonoscopy as an outpatient. Procedure Code(s):     --- Professional ---                        [phone], Esophagogastroduodenoscopy, flexible,                         transoral; diagnostic, including collection of                         specimen(s) by brushing or washing, when performed                         (separate procedure) Diagnosis Code(s):     --- Professional ---                        D62, Acute posthemorrhagic anemia  K25.9, Gastric ulcer, unspecified as acute or chronic,                         without hemorrhage or perforation CPT copyright 2022 American Medical Association. All rights reserved. The codes documented in this report are preliminary and upon coder review may  be revised to meet current compliance requirements. Rogelia Copping MD, MD 08/22/2024 1:46:47 PM This report has been signed electronically. Number of Addenda: 0 Note Initiated On: 08/22/2024 1:16 PM Estimated Blood Loss:  Estimated blood loss: none.      [facility]

## 2024-08-22 NOTE — Progress Notes (Signed)
 The patient came in with profound anemia and black stools that he states started turning back to normal color yesterday and today.  The patient has denied any NSAID use recently.  The patient did have an upper endoscopy with a ulcer in the fundus which had no signs of acute bleeding and there is no blood seen in the stomach or small bowel.  Due to the patient's profound anemia he should have a colonoscopy as an outpatient.  As far as being an inpatient nothing further to do from a GI point of view.  I will sign off.  Please call if any further GI concerns or questions.  We would like to thank you for the opportunity to participate in the care of Christopher Terrell.

## 2024-08-22 NOTE — Anesthesia Preprocedure Evaluation (Signed)
 Anesthesia Evaluation  Patient identified by MRN, date of birth, ID band Patient awake    Reviewed: Allergy & Precautions, H&P , NPO status , Patient's Chart, lab work & pertinent test results, reviewed documented beta blocker date and time   Airway Mallampati: II   Neck ROM: full    Dental  (+) Poor Dentition   Pulmonary neg pulmonary ROS, former smoker   Pulmonary exam normal        Cardiovascular Exercise Tolerance: Good hypertension, On Medications negative cardio ROS Normal cardiovascular exam Rhythm:regular Rate:Normal     Neuro/Psych  PSYCHIATRIC DISORDERS Anxiety Depression    negative neurological ROS     GI/Hepatic hiatal hernia,,,(+) Hepatitis -  Endo/Other  negative endocrine ROS    Renal/GU negative Renal ROS  negative genitourinary   Musculoskeletal   Abdominal   Peds  Hematology negative hematology ROS (+)   Anesthesia Other Findings Past Medical History: No date: Hypertension Past Surgical History: No date: HERNIA REPAIR No date: SMALL INTESTINE SURGERY   Reproductive/Obstetrics negative OB ROS                              Anesthesia Physical Anesthesia Plan  ASA: 2 and emergent  Anesthesia Plan: General   Post-op Pain Management:    Induction:   PONV Risk Score and Plan:   Airway Management Planned:   Additional Equipment:   Intra-op Plan:   Post-operative Plan:   Informed Consent: I have reviewed the patients History and Physical, chart, labs and discussed the procedure including the risks, benefits and alternatives for the proposed anesthesia with the patient or authorized representative who has indicated his/her understanding and acceptance.     Dental Advisory Given  Plan Discussed with: CRNA  Anesthesia Plan Comments:         Anesthesia Quick Evaluation

## 2024-08-22 NOTE — ED Notes (Signed)
 Report given to endo. Pt to remain NPO. Last ate at 1930 last night and last drank a sip of water at 0400 today.

## 2024-08-22 NOTE — Anesthesia Procedure Notes (Signed)
 Procedure Name: General with mask airway Date/Time: 08/22/2024 1:30 PM  Performed by: Ledora Duncan, CRNAPre-anesthesia Checklist: Patient identified, Emergency Drugs available, Suction available and Patient being monitored Patient Re-evaluated:Patient Re-evaluated prior to induction Oxygen Delivery Method: Simple face mask Induction Type: IV induction Placement Confirmation: positive ETCO2 and breath sounds checked- equal and bilateral Dental Injury: Teeth and Oropharynx as per pre-operative assessment

## 2024-08-23 ENCOUNTER — Telehealth: Payer: Self-pay

## 2024-08-23 ENCOUNTER — Other Ambulatory Visit: Payer: Self-pay

## 2024-08-23 DIAGNOSIS — K922 Gastrointestinal hemorrhage, unspecified: Secondary | ICD-10-CM | POA: Diagnosis not present

## 2024-08-23 LAB — TYPE AND SCREEN
ABO/RH(D): O NEG
Antibody Screen: NEGATIVE
Unit division: 0
Unit division: 0

## 2024-08-23 LAB — BPAM RBC
Blood Product Expiration Date: 202512312359
Blood Product Expiration Date: 202512312359
ISSUE DATE / TIME: 202512020737
ISSUE DATE / TIME: 202512021127
Unit Type and Rh: 5100
Unit Type and Rh: 5100

## 2024-08-23 LAB — BASIC METABOLIC PANEL WITH GFR
Anion gap: 11 (ref 5–15)
BUN: 9 mg/dL (ref 6–20)
CO2: 24 mmol/L (ref 22–32)
Calcium: 8.2 mg/dL — ABNORMAL LOW (ref 8.9–10.3)
Chloride: 104 mmol/L (ref 98–111)
Creatinine, Ser: 1.04 mg/dL (ref 0.61–1.24)
GFR, Estimated: >60 mL/min (ref >60)
Glucose, Bld: 94 mg/dL (ref 70–99)
Potassium: 4 mmol/L (ref 3.5–5.1)
Sodium: 139 mmol/L (ref 135–145)

## 2024-08-23 LAB — CBC
HCT: 21.2 % — ABNORMAL LOW (ref 39.0–52.0)
Hemoglobin: 7.1 g/dL — ABNORMAL LOW (ref 13.0–17.0)
MCH: 31.3 pg (ref 26.0–34.0)
MCHC: 33.5 g/dL (ref 30.0–36.0)
MCV: 93.4 fL (ref 80.0–100.0)
Platelets: 245 K/uL (ref 150–400)
RBC: 2.27 MIL/uL — ABNORMAL LOW (ref 4.22–5.81)
RDW: 16.6 % — ABNORMAL HIGH (ref 11.5–15.5)
WBC: 8.8 K/uL (ref 4.0–10.5)
nRBC: 0.7 % — ABNORMAL HIGH (ref 0.0–0.2)

## 2024-08-23 MED ORDER — PANTOPRAZOLE SODIUM 40 MG PO TBEC
40.0000 mg | DELAYED_RELEASE_TABLET | Freq: Every day | ORAL | 1 refills | Status: DC
  Filled 2024-08-23: qty 90, 90d supply, fill #0

## 2024-08-23 NOTE — Discharge Summary (Addendum)
 Christopher Terrell FMW:[id number] DOB: 1986-04-10 DOA: 08/22/2024  PCP: Marylynn Verneita CROME, MD  Admit date: 08/22/2024 Discharge date: 08/23/2024  Time spent: 35 minutes  Recommendations for Outpatient Follow-up:  Pcp f/u, trend hgb GI f/u, nees colonoscopy, possible repeat upper endoscopy    Discharge Diagnoses:  Principal Problem:   GI bleed Active Problems:   Obesity (BMI 30.0-34.9)   Hypertension   Metabolic dysfunction-associated steatohepatitis (MASH)   Symptomatic anemia   Discharge Condition: improved  Diet recommendation: heart healthy  Filed Weights   08/22/24 1234  Weight: 95.3 kg    History of present illness:  From admission h and p Christopher Terrell is a 38 y.o. male with medical history significant of HTN, alcohol use, remote history of GI bleed from a hiatal hernia status post hiatal hernia repair 20 years ago, presented with worsening of black tarry stool and increasing exertional dyspnea.   Patient started noticed stool color turned black 7 days ago, denies any abdominal pain no nauseous vomiting, over the weekend he started to feel increasing exertional dyspnea but denied any chest pain no cough.  He went to see PCP yesterday and PCP sent CBC and did a guaiac test in the office which was positive.  He was instructed to come to ED by PCP yesterday evening.  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  He has been sober since.  Hospital Course:  Patient presents with one week melena (resolved a couple of days prior to presentation) and several days exertional dyspnea. Found to have iron deficiency anemia. EGD revealed non-bleeding gastric ulcer, the likely source of bleeding. Denies nsaids, does drink heavily on weekends. REceived 2 units blood and IV iron. Hgb stable, BM this morning brown, dyspnea resolved and ambulating without difficulty, tolerating PO, GI has signed off. Will discharge on BID  ppi, hold home antihypertensives pending pcp f/u (soft BPs here in setting of blood loss), alcohol reduction advised, GI advises outpatient colonoscopy and may also want to repeat EGD, per gi site of ulcer not consistent w/ h pylori.  Procedures: EGD   Consultations: GI  Discharge Exam: Vitals:   08/23/24 0500 08/23/24 0821  BP: 112/79 121/83  Pulse: (!) 104 (!) 102  Resp: 17 17  Temp: 98.1 F (36.7 C) 98.3 F (36.8 C)  SpO2: 95% 96%    General: NAD Cardiovascular: RRR Respiratory: CTAB  Discharge Instructions   Discharge Instructions     Diet - low sodium heart healthy   Complete by: As directed    Increase activity slowly   Complete by: As directed       Allergies as of 08/23/2024   No Known Allergies      Medication List     PAUSE taking these medications    losartan  50 MG tablet Wait to take this until your doctor or other care provider tells you to start again. Commonly known as: COZAAR  Take 1 tablet (50 mg total) by mouth at bedtime.   metoprolol  succinate 50 MG 24 hr tablet Wait to take this until your doctor or other care provider tells you to start again. Commonly known as: TOPROL -XL Take 1 tablet (50 mg total) by mouth daily. Take with or immediately following a meal.       STOP taking these medications    oseltamivir  75 MG capsule Commonly known as: Tamiflu        TAKE these medications  amphetamine -dextroamphetamine  7.5 MG tablet Commonly known as: ADDERALL  Take 1 tablet by mouth daily in the afternoon.   amphetamine -dextroamphetamine  10 MG 24 hr capsule Commonly known as: Adderall  XR Take 1 capsule (10 mg total) by mouth daily.   amphetamine -dextroamphetamine  10 MG 24 hr capsule Commonly known as: Adderall  XR Take 1 capsule (10 mg total) by mouth daily.   amphetamine -dextroamphetamine  7.5 MG tablet Commonly known as: ADDERALL  Take 1 tablet by mouth daily in the afternoon.   buPROPion  150 MG 24 hr tablet Commonly known  as: Wellbutrin  XL Take 1 tablet (150 mg total) by mouth daily.   chlorhexidine  0.12 % solution Commonly known as: PERIDEX  Rinse with 15 ml  2 (two) times daily for 30 seconds then expectorate. Do not eat/drink/rise for 30 minutes.   clobetasol  ointment 0.05 % Commonly known as: TEMOVATE  Apply to affected areas twice daily until smooth, then stop and use as needed.   clonazePAM  0.25 MG disintegrating tablet Commonly known as: KLONOPIN  Take 1 tablet (0.25 mg total) by mouth daily as needed.   Dupixent  300 MG/2ML prefilled syringe Generic drug: dupilumab  Inject 300 mg syringe subcutaneously every other week.   EPINEPHrine  0.3 mg/0.3 mL Soaj injection Commonly known as: EPI-PEN Inject 0.3 mg into the muscle as directed.   pantoprazole 40 MG tablet Commonly known as: Protonix Take 1 tablet (40 mg total) by mouth daily.   PreviDent 5000 Booster Plus 1.1 % Pste Generic drug: Sodium Fluoride  Brush with pea sized amount for 2 minutes twice a day. Spit, do not swallow. Do not eat, drink or rinse mouth for 30 mins   traZODone  50 MG tablet Commonly known as: DESYREL  Take 1 tablet (50 mg total) by mouth at bedtime.       No Known Allergies  Follow-up Information     Marylynn Verneita CROME, MD Follow up.   Specialty: Internal Medicine Contact information: [address] [place] [address] [phone]         Jinny Carmine, MD Follow up.   Specialty: Gastroenterology Contact information: [address] [place]  [place] [address] [phone]                  The results of significant diagnostics from this hospitalization (including imaging, microbiology, ancillary and laboratory) are listed below for reference.    Significant Diagnostic Studies: DG Chest 2 View Result Date: 08/22/2024 EXAM: 2 VIEW(S) XRAY OF THE CHEST 08/22/2024 06:21:00 AM COMPARISON: None available. CLINICAL HISTORY: Shortness of breath FINDINGS: LUNGS AND PLEURA: No focal pulmonary  opacity. No pleural effusion. No pneumothorax. HEART AND MEDIASTINUM: Overlapping cardiac leads. No acute abnormality of the cardiac and mediastinal silhouettes. BONES AND SOFT TISSUES: No acute osseous abnormality. IMPRESSION: 1. No acute cardiopulmonary process to explain shortness of breath. Electronically signed by: Waddell Calk MD 08/22/2024 06:29 AM EST RP Workstation: [id number]    Microbiology: No results found for this or any previous visit (from the past 240 hours).   Labs: Basic Metabolic Panel: Recent Labs  Lab 08/22/24 0508 08/23/24 0441  NA 140 139  K 3.7 4.0  CL 105 104  CO2 24 24  GLUCOSE 106* 94  BUN 11 9  CREATININE 1.01 1.04  CALCIUM 8.3* 8.2*   Liver Function Tests: No results for input(s): AST, ALT, ALKPHOS, BILITOT, PROT, ALBUMIN in the last 168 hours. No results for input(s): LIPASE, AMYLASE in the last 168 hours. No results for input(s): AMMONIA in the last 168 hours. CBC: Recent Labs  Lab 08/21/24 1008 08/22/24  9491 08/22/24 1529 08/22/24 2000 08/23/24 0441  WBC 14.0* 10.7*  --   --  8.8  NEUTROABS  --  7.4  --   --   --   HGB 5.2* 4.9* 7.4* 7.7* 7.1*  HCT 15.9* 15.5* 22.3* 23.3* 21.2*  MCV 98.1 100.6*  --   --  93.4  PLT 267 251  --   --  245   Cardiac Enzymes: No results for input(s): CKTOTAL, CKMB, CKMBINDEX, TROPONINI in the last 168 hours. BNP: BNP (last 3 results) No results for input(s): BNP in the last 8760 hours.  ProBNP (last 3 results) No results for input(s): PROBNP in the last 8760 hours.  CBG: No results for input(s): GLUCAP in the last 168 hours.     Signed:  Devaughn KATHEE Ban MD.  [facility] 08/23/2024, 9:14 AM

## 2024-08-23 NOTE — Plan of Care (Signed)

## 2024-08-23 NOTE — Anesthesia Postprocedure Evaluation (Signed)
 Anesthesia Post Note  Patient: Christopher Terrell  Procedure(s) Performed: EGD (ESOPHAGOGASTRODUODENOSCOPY)  Patient location during evaluation: PACU Anesthesia Type: General Level of consciousness: awake and alert Pain management: pain level controlled Vital Signs Assessment: post-procedure vital signs reviewed and stable Respiratory status: spontaneous breathing, nonlabored ventilation, respiratory function stable and patient connected to nasal cannula oxygen Cardiovascular status: blood pressure returned to baseline and stable Postop Assessment: no apparent nausea or vomiting Anesthetic complications: no   No notable events documented.   Last Vitals:  Vitals:   08/23/24 0500 08/23/24 0821  BP: 112/79 121/83  Pulse: (!) 104 (!) 102  Resp: 17 17  Temp: 36.7 C 36.8 C  SpO2: 95% 96%    Last Pain:  Vitals:   08/22/24 2028  TempSrc:   PainSc: 0-No pain                 Lynwood KANDICE Clause

## 2024-08-23 NOTE — Telephone Encounter (Signed)
 Jacquline, the discharge nurse, called to schedule the patient's follow-up visit after hospitalization. I informed her that the earliest available appointment was on January 20th. She confirmed that the date was acceptable, and the patient was scheduled for October 10, 2024, at 9:00 AM. A new patient packet was also sent out for completion.

## 2024-08-23 NOTE — Plan of Care (Signed)
  Problem: Clinical Measurements: Goal: Ability to maintain clinical measurements within normal limits will improve Outcome: Progressing Goal: Will remain free from infection Outcome: Progressing Goal: Diagnostic test results will improve Outcome: Progressing Goal: Respiratory complications will improve Outcome: Progressing Goal: Cardiovascular complication will be avoided Outcome: Progressing   Problem: Activity: Goal: Risk for activity intolerance will decrease Outcome: Progressing   Problem: Nutrition: Goal: Adequate nutrition will be maintained Outcome: Progressing   Problem: Coping: Goal: Level of anxiety will decrease Outcome: Progressing   Problem: Pain Managment: Goal: General experience of comfort will improve and/or be controlled Outcome: Progressing   Problem: Safety: Goal: Ability to remain free from injury will improve Outcome: Progressing   Problem: Skin Integrity: Goal: Risk for impaired skin integrity will decrease Outcome: Progressing

## 2024-08-24 ENCOUNTER — Other Ambulatory Visit (HOSPITAL_COMMUNITY): Payer: Self-pay

## 2024-08-24 ENCOUNTER — Other Ambulatory Visit: Payer: Self-pay

## 2024-08-24 ENCOUNTER — Telehealth: Payer: Self-pay

## 2024-08-24 MED ORDER — PANTOPRAZOLE SODIUM 40 MG PO TBEC
40.0000 mg | DELAYED_RELEASE_TABLET | Freq: Two times a day (BID) | ORAL | 0 refills | Status: DC
  Filled 2024-08-24 – 2024-10-05 (×4): qty 180, 90d supply, fill #0

## 2024-08-24 NOTE — Progress Notes (Signed)
 Spoke with Patient and aware of new Shipping Date 12/04 for 12/05

## 2024-08-24 NOTE — Addendum Note (Signed)
 Addended by: LANNIE ANDREA GRADE on: 08/24/2024 10:09 AM   Modules accepted: Orders

## 2024-08-24 NOTE — Transitions of Care (Post Inpatient/ED Visit) (Signed)
 08/24/2024  Name: Christopher Terrell MRN: [id number] DOB: 02/09/1986  Today's TOC FU Call Status: Today's TOC FU Call Status:: Successful TOC FU Call Completed TOC FU Call Complete Date: 08/24/24  Patient's Name and Date of Birth confirmed. Name, DOB  Transition Care Management Follow-up Telephone Call Date of Discharge: 08/23/24 Discharge Facility: [facility]) Type of Discharge: Inpatient Admission Primary Inpatient Discharge Diagnosis:: GI bleed How have you been since you were released from the hospital?: Better Any questions or concerns?: No  Items Reviewed: Did you receive and understand the discharge instructions provided?: Yes Medications obtained,verified, and reconciled?: Yes (Medications Reviewed) Any new allergies since your discharge?: No Dietary orders reviewed?: Yes Do you have support at home?: No  Medications Reviewed Today: Medications Reviewed Today     Reviewed by Emmitt Pan, LPN (Licensed Practical Nurse) on 08/24/24 at 1000  Med List Status: <None>   Medication Order Taking? Sig Documenting Provider Last Dose Status Informant  amphetamine -dextroamphetamine  (ADDERALL  XR) 10 MG 24 hr capsule [id number]  Take 1 capsule (10 mg total) by mouth daily.  Patient not taking: Reported on 08/24/2024   Marylynn Verneita CROME, MD  Active Self, Pharmacy Records  amphetamine -dextroamphetamine  (ADDERALL  XR) 10 MG 24 hr capsule [id number]  Take 1 capsule (10 mg total) by mouth daily.  Patient not taking: Reported on 08/24/2024   Marylynn Verneita CROME, MD  Active Self, Pharmacy Records  amphetamine -dextroamphetamine  (ADDERALL ) 7.5 MG tablet [id number]  Take 1 tablet by mouth daily in the afternoon.  Patient not taking: Reported on 08/24/2024   Marylynn Verneita CROME, MD  Active Self, Pharmacy Records  amphetamine -dextroamphetamine  (ADDERALL ) 7.5 MG tablet [id number]  Take 1 tablet by mouth daily in the afternoon.  Patient not taking: Reported on 08/24/2024   Marylynn Verneita CROME, MD  Active Self, Pharmacy Records  buPROPion  (WELLBUTRIN  XL) 150 MG 24 hr tablet [id number] Yes Take 1 tablet (150 mg total) by mouth daily. Marylynn Verneita CROME, MD  Active Self, Pharmacy Records  chlorhexidine  (PERIDEX ) 0.12 % solution [id number]  Rinse with 15 ml  2 (two) times daily for 30 seconds then expectorate. Do not eat/drink/rise for 30 minutes.  Patient not taking: Reported on 08/24/2024     Active Self, Pharmacy Records  clobetasol  ointment (TEMOVATE ) 0.05 % [id number] Yes Apply to affected areas twice daily until smooth, then stop and use as needed.   Active Self, Pharmacy Records  clonazePAM  (KLONOPIN ) 0.25 MG disintegrating tablet [id number] Yes Take 1 tablet (0.25 mg total) by mouth daily as needed. Webb, Padonda B, FNP  Active Self, Pharmacy Records           Med Note [facility], [facility] A   Tue Aug 22, 2024  9:07 AM) prn  dupilumab  (DUPIXENT ) 300 MG/2ML prefilled syringe [id number] Yes Inject 300 mg syringe subcutaneously every other week. Jegede, Olugbemiga E, MD  Active Self, Pharmacy Records  EPINEPHrine  0.3 mg/0.3 mL IJ SOAJ injection [id number] Yes Inject 0.3 mg into the muscle as directed. Schnier, Cordella MATSU, MD  Active Self, Pharmacy Records           Med Note [facility], [facility] A   Tue Aug 22, 2024  9:08 AM) prn  losartan  (COZAAR ) 50 MG tablet [id number]  Take 1 tablet (50 mg total) by mouth at bedtime.  Patient not taking: Reported on 08/24/2024   Marylynn Verneita CROME, MD  Active Self, Pharmacy Records  metoprolol  succinate (TOPROL -XL) 50 MG 24 hr tablet [id number]  Take 1 tablet (50 mg total) by  mouth daily. Take with or immediately following a meal.  Patient not taking: Reported on 08/24/2024   Marylynn Verneita CROME, MD  Active Self, Pharmacy Records  pantoprazole (PROTONIX) 40 MG tablet [id number] Yes Take 1 tablet (40 mg total) by mouth daily. Wouk, Devaughn Sayres, MD  Active   Sodium Fluoride  1.1 % PSTE [id number]  Brush with pea sized amount for 2 minutes twice a day. Spit, do not swallow. Do  not eat, drink or rinse mouth for 30 mins  Patient not taking: Reported on 08/24/2024     Active Self, Pharmacy Records  traZODone  (DESYREL ) 50 MG tablet [id number] Yes Take 1 tablet (50 mg total) by mouth at bedtime. Marylynn Verneita CROME, MD  Active Self, Pharmacy Records            Home Care and Equipment/Supplies: Were Home Health Services Ordered?: NA Any new equipment or medical supplies ordered?: NA  Functional Questionnaire: Do you need assistance with bathing/showering or dressing?: No Do you need assistance with meal preparation?: No Do you need assistance with eating?: No Do you have difficulty maintaining continence: No Do you need assistance with getting out of bed/getting out of a chair/moving?: No Do you have difficulty managing or taking your medications?: No  Follow up appointments reviewed: PCP Follow-up appointment confirmed?: Yes Date of PCP follow-up appointment?: 09/04/24 Follow-up Provider: [facility] Follow-up appointment confirmed?: Yes Date of Specialist follow-up appointment?: 10/10/24 Follow-Up Specialty Provider:: GI Do you need transportation to your follow-up appointment?: No Do you understand care options if your condition(s) worsen?: Yes-patient verbalized understanding    SIGNATURE Julian Lemmings, LPN [facility] Nurse Health Advisor Direct Dial [phone]

## 2024-08-24 NOTE — Telephone Encounter (Signed)
 Hospital follow up is scheduled for 09/04/2024.

## 2024-09-04 ENCOUNTER — Ambulatory Visit: Admitting: Internal Medicine

## 2024-09-04 ENCOUNTER — Other Ambulatory Visit: Payer: Self-pay

## 2024-09-04 ENCOUNTER — Telehealth: Payer: Self-pay

## 2024-09-04 ENCOUNTER — Encounter: Payer: Self-pay | Admitting: Internal Medicine

## 2024-09-04 VITALS — BP 120/78 | HR 63 | Ht 67.0 in | Wt 221.6 lb

## 2024-09-04 DIAGNOSIS — K254 Chronic or unspecified gastric ulcer with hemorrhage: Secondary | ICD-10-CM

## 2024-09-04 DIAGNOSIS — Z09 Encounter for follow-up examination after completed treatment for conditions other than malignant neoplasm: Secondary | ICD-10-CM | POA: Insufficient documentation

## 2024-09-04 DIAGNOSIS — L400 Psoriasis vulgaris: Secondary | ICD-10-CM

## 2024-09-04 DIAGNOSIS — Z0000 Encounter for general adult medical examination without abnormal findings: Secondary | ICD-10-CM | POA: Diagnosis not present

## 2024-09-04 DIAGNOSIS — K7581 Nonalcoholic steatohepatitis (NASH): Secondary | ICD-10-CM

## 2024-09-04 DIAGNOSIS — F1011 Alcohol abuse, in remission: Secondary | ICD-10-CM

## 2024-09-04 DIAGNOSIS — F900 Attention-deficit hyperactivity disorder, predominantly inattentive type: Secondary | ICD-10-CM

## 2024-09-04 DIAGNOSIS — D649 Anemia, unspecified: Secondary | ICD-10-CM

## 2024-09-04 DIAGNOSIS — F411 Generalized anxiety disorder: Secondary | ICD-10-CM

## 2024-09-04 LAB — CBC WITH DIFFERENTIAL/PLATELET
Basophils Absolute: 0 K/uL (ref 0.0–0.1)
Basophils Relative: 0.8 % (ref 0.0–3.0)
Eosinophils Absolute: 0.1 K/uL (ref 0.0–0.7)
Eosinophils Relative: 1 % (ref 0.0–5.0)
HCT: 32.5 % — ABNORMAL LOW (ref 39.0–52.0)
Hemoglobin: 10.9 g/dL — ABNORMAL LOW (ref 13.0–17.0)
Lymphocytes Relative: 32.5 % (ref 12.0–46.0)
Lymphs Abs: 1.7 K/uL (ref 0.7–4.0)
MCHC: 33.6 g/dL (ref 30.0–36.0)
MCV: 88.4 fl (ref 78.0–100.0)
Monocytes Absolute: 0.5 K/uL (ref 0.1–1.0)
Monocytes Relative: 8.8 % (ref 3.0–12.0)
Neutro Abs: 3 K/uL (ref 1.4–7.7)
Neutrophils Relative %: 56.9 % (ref 43.0–77.0)
Platelets: 494 K/uL — ABNORMAL HIGH (ref 150.0–400.0)
RBC: 3.68 Mil/uL — ABNORMAL LOW (ref 4.22–5.81)
RDW: 14.7 % (ref 11.5–15.5)
WBC: 5.2 K/uL (ref 4.0–10.5)

## 2024-09-04 LAB — LIPID PANEL
Cholesterol: 164 mg/dL (ref 0–200)
HDL: 32 mg/dL — ABNORMAL LOW (ref >39.00)
LDL Cholesterol: 105 mg/dL — ABNORMAL HIGH (ref 0–99)
NonHDL: 132.37
Total CHOL/HDL Ratio: 5
Triglycerides: 137 mg/dL (ref 0.0–149.0)
VLDL: 27.4 mg/dL (ref 0.0–40.0)

## 2024-09-04 LAB — COMPREHENSIVE METABOLIC PANEL WITH GFR
ALT: 19 U/L (ref 0–53)
AST: 19 U/L (ref 0–37)
Albumin: 4.5 g/dL (ref 3.5–5.2)
Alkaline Phosphatase: 61 U/L (ref 39–117)
BUN: 13 mg/dL (ref 6–23)
CO2: 26 meq/L (ref 19–32)
Calcium: 9.9 mg/dL (ref 8.4–10.5)
Chloride: 104 meq/L (ref 96–112)
Creatinine, Ser: 0.92 mg/dL (ref 0.40–1.50)
GFR: 104.24 mL/min (ref >60.00)
Glucose, Bld: 84 mg/dL (ref 70–99)
Potassium: 3.9 meq/L (ref 3.5–5.1)
Sodium: 138 meq/L (ref 135–145)
Total Bilirubin: 0.4 mg/dL (ref 0.2–1.2)
Total Protein: 7.3 g/dL (ref 6.0–8.3)

## 2024-09-04 LAB — B12 AND FOLATE PANEL
Folate: 12.2 ng/mL (ref >5.9)
Vitamin B-12: 298 pg/mL (ref 211–911)

## 2024-09-04 MED ORDER — AMPHETAMINE-DEXTROAMPHET ER 10 MG PO CP24
10.0000 mg | ORAL_CAPSULE | Freq: Every day | ORAL | 0 refills | Status: AC
  Filled 2024-09-04 (×2): qty 30, 30d supply, fill #0

## 2024-09-04 MED ORDER — CLONAZEPAM 0.25 MG PO TBDP
0.2500 mg | ORAL_TABLET | Freq: Every day | ORAL | 0 refills | Status: AC | PRN
  Filled 2024-09-04 (×2): qty 30, 30d supply, fill #0

## 2024-09-04 MED ORDER — BUPROPION HCL ER (XL) 300 MG PO TB24
300.0000 mg | ORAL_TABLET | Freq: Every day | ORAL | 1 refills | Status: DC
  Filled 2024-09-04: qty 30, 30d supply, fill #0
  Filled 2024-10-04: qty 30, 30d supply, fill #1
  Filled 2024-10-04: qty 30, 30d supply, fill #0

## 2024-09-04 MED ORDER — AMPHETAMINE-DEXTROAMPHETAMINE 7.5 MG PO TABS
7.5000 mg | ORAL_TABLET | Freq: Every day | ORAL | 0 refills | Status: DC
  Filled 2024-09-04 (×2): qty 30, 30d supply, fill #0

## 2024-09-04 MED ORDER — BUPROPION HCL ER (XL) 150 MG PO TB24
150.0000 mg | ORAL_TABLET | Freq: Every day | ORAL | 0 refills | Status: DC
  Filled 2024-09-04: qty 30, 30d supply, fill #0

## 2024-09-04 NOTE — Assessment & Plan Note (Signed)
 Managed with biweekly DUPIXENT  .  OOP cost becoming > 500/month.

## 2024-09-04 NOTE — Assessment & Plan Note (Addendum)
 Improved with prn use of clonazepam , management of ADD and depression o changes today. Clonazepam   use reviewed.  He is using it prn  to avoid self medication with alcohol. Refill history confirmed via [place] Controlled Substance databas, accessed by me today.SABRA

## 2024-09-04 NOTE — Telephone Encounter (Signed)
 Patient states at check-out that he would like to see if he needs to cancel his 09/18/2024 appointment for a physical with Dr. Verneita Kettering.  Patient states they talked about it during his appointment today, but he isn't sure what Dr. Kettering decided on.  Please let him know.

## 2024-09-04 NOTE — Assessment & Plan Note (Signed)

## 2024-09-04 NOTE — Assessment & Plan Note (Signed)
 Patient is stable post discharge and has no new issues or questions about his discharge plans.  He is scheduled to see GI on Dec 20.  His stools are normal colored and solid,  and his exertioanl dyspnea due to profound anemia has resolved.  Repeat labs today

## 2024-09-04 NOTE — Progress Notes (Signed)
 Subjective:  Patient ID: Christopher Terrell, male    DOB: [birth year]  Age: 38 y.o. MRN: [id number]  CC: The primary encounter diagnosis was Metabolic dysfunction-associated steatohepatitis (MASH). Diagnoses of Plaque psoriasis, Hypocalcemia, Anemia, unspecified type, Attention deficit hyperactivity disorder (ADHD), predominantly inattentive type, Encounter for preventive health examination, Generalized anxiety disorder, Hospital discharge follow-up, Gastrointestinal hemorrhage associated with gastric ulcer, and History of alcohol abuse were also pertinent to this visit.   HPI [name] presents for  Chief Complaint  Patient presents with   Hospitalization Follow-up   Christopher Terrell is a 38 yr old male with a history of hiatal hernia,  alcohol abuse and elevated liver enzymes (noted on Feb 2025) who was admitted to [facility] on Dec 2 with a hgb of 4.9 and a 7 day history of dark tarry stools and progressive dyspnea.  He started taking protonix  the week before ,  and stool color had improved by the time he presented  but because of his profound anemia he was transfused 2 units,  given IV iron   and underwent EGD on Dec 2 which noted a nonbleeding gastric ulcer.  He was discharged home on Dec 3.    Has follow up with PA  on Dec 20th .  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  Taking an MVI since discharge  Elevated liver enzymes: noted in February ; did not return for repeat levels or for ultrasound.  LFTS were not checked during December hospitalization.    Has been alcohol abstinent  since [facility]      Outpatient Medications Prior to Visit  Medication Sig Dispense Refill   amphetamine -dextroamphetamine  (ADDERALL  XR) 10 MG 24 hr capsule Take 1 capsule (10 mg total) by mouth daily. 30 capsule 0   amphetamine -dextroamphetamine  (ADDERALL ) 7.5 MG tablet Take 1 tablet by mouth daily in the afternoon. 30 tablet 0   clobetasol   ointment (TEMOVATE ) 0.05 % Apply to affected areas twice daily until smooth, then stop and use as needed. 45 g 1   dupilumab  (DUPIXENT ) 300 MG/2ML prefilled syringe Inject 300 mg syringe subcutaneously every other week. 4 mL 11   EPINEPHrine  0.3 mg/0.3 mL IJ SOAJ injection Inject 0.3 mg into the muscle as directed. 2 each 2   pantoprazole  (PROTONIX ) 40 MG tablet Take 1 tablet (40 mg total) by mouth 2 (two) times daily before a meal. 180 tablet 0   Sodium Fluoride  1.1 % PSTE Brush with pea sized amount for 2 minutes twice a day. Spit, do not swallow. Do not eat, drink or rinse mouth for 30 mins 100 mL 3   traZODone  (DESYREL ) 50 MG tablet Take 1 tablet (50 mg total) by mouth at bedtime. 90 tablet 1   amphetamine -dextroamphetamine  (ADDERALL  XR) 10 MG 24 hr capsule Take 1 capsule (10 mg total) by mouth daily. 30 capsule 0   amphetamine -dextroamphetamine  (ADDERALL ) 7.5 MG tablet Take 1 tablet by mouth daily in the afternoon. 30 tablet 0   buPROPion  (WELLBUTRIN  XL) 150 MG 24 hr tablet Take 1 tablet (150 mg total) by mouth daily. 30 tablet 0   clonazePAM  (KLONOPIN ) 0.25 MG disintegrating tablet Take 1 tablet (0.25 mg total) by mouth daily as needed. 30 tablet 0   losartan  (COZAAR ) 50 MG tablet Take 1 tablet (50 mg total) by mouth at bedtime. (Patient not taking: Reported on 09/04/2024) 90 tablet 1   metoprolol  succinate (TOPROL -XL) 50 MG 24 hr tablet Take  1 tablet (50 mg total) by mouth daily. Take with or immediately following a meal. (Patient not taking: Reported on 09/04/2024) 90 tablet 1   chlorhexidine  (PERIDEX ) 0.12 % solution Rinse with 15 ml  2 (two) times daily for 30 seconds then expectorate. Do not eat/drink/rise for 30 minutes. (Patient not taking: Reported on 08/24/2024) 473 mL 3   No facility-administered medications prior to visit.    Review of Systems;  Patient denies headache, fevers, malaise, unintentional weight loss, skin rash, eye pain, sinus congestion and sinus pain, sore throat,  dysphagia,  hemoptysis , cough, dyspnea, wheezing, chest pain, palpitations, orthopnea, edema, abdominal pain, nausea, melena, diarrhea, constipation, flank pain, dysuria, hematuria, urinary  Frequency, nocturia, numbness, tingling, seizures,  Focal weakness, Loss of consciousness,  Tremor, insomnia, depression, anxiety, and suicidal ideation.      Objective:  BP 120/78   Pulse 63   Ht 5' 7 (1.702 m)   Wt 221 lb 9.6 oz (100.5 kg)   SpO2 98%   BMI 34.71 kg/m   BP Readings from Last 3 Encounters:  09/04/24 120/78  08/23/24 121/83  11/18/23 120/74    Wt Readings from Last 3 Encounters:  09/04/24 221 lb 9.6 oz (100.5 kg)  08/22/24 210 lb (95.3 kg)  11/18/23 233 lb 12.8 oz (106.1 kg)    Physical Exam Vitals reviewed.  Constitutional:      General: He is not in acute distress.    Appearance: Normal appearance. He is normal weight. He is not ill-appearing, toxic-appearing or diaphoretic.  HENT:     Head: Normocephalic.  Eyes:     General: No scleral icterus.       Right eye: No discharge.        Left eye: No discharge.     Conjunctiva/sclera: Conjunctivae normal.  Cardiovascular:     Rate and Rhythm: Normal rate and regular rhythm.     Heart sounds: Normal heart sounds.  Pulmonary:     Effort: Pulmonary effort is normal. No respiratory distress.     Breath sounds: Normal breath sounds.  Musculoskeletal:        General: Normal range of motion.     Cervical back: Normal range of motion.  Skin:    General: Skin is warm and dry.  Neurological:     General: No focal deficit present.     Mental Status: He is alert and oriented to person, place, and time. Mental status is at baseline.  Psychiatric:        Mood and Affect: Mood normal.        Behavior: Behavior normal.        Thought Content: Thought content normal.        Judgment: Judgment normal.     Lab Results  Component Value Date   HGBA1C 5.6 11/19/2023   HGBA1C 5.5 09/29/2022   HGBA1C 5.5 01/07/2021    Lab  Results  Component Value Date   CREATININE 1.04 08/23/2024   CREATININE 1.01 08/22/2024   CREATININE 0.90 11/19/2023    Lab Results  Component Value Date   WBC 8.8 08/23/2024   HGB 7.1 (L) 08/23/2024   HCT 21.2 (L) 08/23/2024   PLT 245 08/23/2024   GLUCOSE 94 08/23/2024   CHOL 220 (H) 11/19/2023   TRIG 263 (H) 11/19/2023   HDL 43 11/19/2023   LDLDIRECT 143 (H) 11/19/2023   LDLCALC 130 (H) 11/19/2023   ALT 96 (H) 11/19/2023   AST 96 (H) 11/19/2023   NA 139 08/23/2024  K 4.0 08/23/2024   CL 104 08/23/2024   CREATININE 1.04 08/23/2024   BUN 9 08/23/2024   CO2 24 08/23/2024   TSH 3.880 11/19/2023   INR 1.0 08/22/2024   HGBA1C 5.6 11/19/2023   MICROALBUR 2.1 (H) 11/18/2023    DG Chest 2 View Result Date: 08/22/2024 EXAM: 2 VIEW(S) XRAY OF THE CHEST 08/22/2024 06:21:00 AM COMPARISON: None available. CLINICAL HISTORY: Shortness of breath FINDINGS: LUNGS AND PLEURA: No focal pulmonary opacity. No pleural effusion. No pneumothorax. HEART AND MEDIASTINUM: Overlapping cardiac leads. No acute abnormality of the cardiac and mediastinal silhouettes. BONES AND SOFT TISSUES: No acute osseous abnormality. IMPRESSION: 1. No acute cardiopulmonary process to explain shortness of breath. Electronically signed by: Waddell Calk MD 08/22/2024 06:29 AM EST RP Workstation: [id number]    Assessment & Plan:  .Metabolic dysfunction-associated steatohepatitis (MASH) Assessment & Plan: Complicated by alcohol abuse,  but reports abstinence for several months.   He did not return for repeat labs in March and did not have the ultrasound done. Will screen for cirrhosis    Orders: -     Comprehensive metabolic panel with GFR -     Lipid panel  Plaque psoriasis Assessment & Plan: Managed with biweekly DUPIXENT  .  OOP cost becoming > 500/month.     Hypocalcemia -     Calcium, ionized  Anemia, unspecified type -     CBC with Differential/Platelet -     B12 and Folate Panel -     Vitamin B1;  Future  Attention deficit hyperactivity disorder (ADHD), predominantly inattentive type Assessment & Plan: His concentration deficit is managed with Adderall  XR 10 mg daily with  7.5   mg of short acting adderall  as needed for weekend or evening work . Refills given today   Encounter for preventive health examination Assessment & Plan: age appropriate education and counseling updated, referrals for preventative services and immunizations addressed, dietary and smoking counseling addressed, most recent labs reviewed.  I have personally reviewed and have noted:   1) the patient's medical and social history 2) The pt's use of alcohol, tobacco, and illicit drugs 3) The patient's current medications and supplements 4) Functional ability including ADL's, fall risk, home safety risk, hearing and visual impairment 5) Diet and physical activities 6) Evidence for depression or mood disorder 7) The patient's height, weight, and BMI have been recorded in the chart.     I have made referrals, and provided counseling and education based on review of the above    Generalized anxiety disorder Assessment & Plan: Improved with prn use of clonazepam , management of ADD and depression o changes today. Clonazepam   use reviewed.  He is using it prn  to avoid self medication with alcohol. Refill history confirmed via [place] Controlled Substance databas, accessed by me today.SABRA    Hospital discharge follow-up Assessment & Plan: Patient is stable post discharge and has no new issues or questions about his discharge plans.  He is scheduled to see GI on Dec 20.  His stools are normal colored and solid,  and his exertioanl dyspnea due to profound anemia has resolved.  Repeat labs today      Gastrointestinal hemorrhage associated with gastric ulcer Assessment & Plan: Attributed to gastric ulcer noted on Dec 2025 EGD; resulting in profound symptomatic anemia.   however ulcer was not bleeding.  Colonoscopy to be  scheduled as outpatient following GI office follow upon Dec 20.  Continue protonix  bid    History of alcohol abuse Assessment &  Plan: Checking B12 , folate and thiamine today to rule out contributions to anemia   Other orders -     buPROPion  HCl ER (XL); Take 1 tablet (300 mg total) by mouth daily.  Dispense: 30 tablet; Refill: 1 -     Amphetamine -Dextroamphetamine ; Take 1 tablet by mouth daily in the afternoon.  Dispense: 30 tablet; Refill: 0 -     Amphetamine -Dextroamphet ER; Take 1 capsule (10 mg total) by mouth daily.  Dispense: 30 capsule; Refill: 0 -     clonazePAM ; Take 1 tablet (0.25 mg total) by mouth daily as needed.  Dispense: 30 tablet; Refill: 0     I spent 34 minutes on the day of this face to face encounter reviewing patient's  most recent visit with cardiology,  nephrology,  and neurology,  prior relevant surgical and non surgical procedures, recent  labs and imaging studies, counseling on weight management,  reviewing the assessment and plan with patient, and post visit ordering and reviewing of  diagnostics and therapeutics with patient  .   Follow-up: Return in about 6 months (around 03/05/2025) for follow up on ADD,   anxiety , hypertension.   Verneita LITTIE Kettering, MD

## 2024-09-04 NOTE — Assessment & Plan Note (Signed)
 His concentration deficit is managed with Adderall  XR 10 mg daily with  7.5   mg of short acting adderall  as needed for weekend or evening work . Refills given today

## 2024-09-04 NOTE — Assessment & Plan Note (Signed)
 Complicated by alcohol abuse,  but reports abstinence for several months.   He did not return for repeat labs in March and did not have the ultrasound done. Will screen for cirrhosis

## 2024-09-04 NOTE — Assessment & Plan Note (Signed)
 Checking B12 , folate and thiamine today to rule out contributions to anemia

## 2024-09-04 NOTE — Assessment & Plan Note (Signed)
 Attributed to gastric ulcer noted on Dec 2025 EGD; resulting in profound symptomatic anemia.   however ulcer was not bleeding.  Colonoscopy to be scheduled as outpatient following GI office follow upon Dec 20.  Continue protonix  bid

## 2024-09-05 LAB — CALCIUM, IONIZED: Calcium, Ion: 5.3 mg/dL (ref 4.7–5.5)

## 2024-09-05 NOTE — Telephone Encounter (Signed)
 noted

## 2024-09-07 ENCOUNTER — Ambulatory Visit: Payer: Self-pay | Admitting: Internal Medicine

## 2024-09-07 ENCOUNTER — Other Ambulatory Visit: Payer: Self-pay

## 2024-09-07 ENCOUNTER — Telehealth: Payer: Self-pay

## 2024-09-07 ENCOUNTER — Other Ambulatory Visit: Payer: Self-pay | Admitting: Internal Medicine

## 2024-09-07 ENCOUNTER — Ambulatory Visit

## 2024-09-07 DIAGNOSIS — E538 Deficiency of other specified B group vitamins: Secondary | ICD-10-CM | POA: Diagnosis not present

## 2024-09-07 MED ORDER — CYANOCOBALAMIN 1000 MCG/ML IJ SOLN
1000.0000 ug | Freq: Once | INTRAMUSCULAR | Status: AC
  Administered 2024-09-07: 15:00:00 1000 ug via INTRAMUSCULAR

## 2024-09-07 MED ORDER — "SYRINGE 25G X 1"" 3 ML MISC"
0 refills | Status: AC
  Filled 2024-09-07: qty 50, 90d supply, fill #0
  Filled 2024-10-04 (×2): qty 50, 50d supply, fill #0

## 2024-09-07 MED ORDER — CYANOCOBALAMIN 1000 MCG/ML IJ SOLN
1000.0000 ug | INTRAMUSCULAR | 0 refills | Status: DC
  Filled 2024-09-07: qty 3, 21d supply, fill #0

## 2024-09-07 NOTE — Telephone Encounter (Signed)
 Dr. Marylynn ordered that the Patient have B12 injections in her lab result note on 09/07/24. Patient came in & wanted to do B12 injections at home. Patient administered a B12 injection to him self in the Right deltoid.as I talked him through the steps. Patient tolerated the B12 injection well.

## 2024-09-07 NOTE — Addendum Note (Signed)
 Addended by: MARYLYNN VERNEITA CROME on: 09/07/2024 05:54 PM   Modules accepted: Orders

## 2024-09-07 NOTE — Progress Notes (Signed)
 Dr. Marylynn ordered that the Patient have B12 injections in her lab result note on 09/07/24. Patient came in & wanted to do B12 injections at home. Patient administered a B12 injection to him self in the Right deltoid.as I talked him through the steps. Patient tolerated the B12 injection well.

## 2024-09-11 ENCOUNTER — Other Ambulatory Visit: Payer: Self-pay

## 2024-09-12 ENCOUNTER — Telehealth: Payer: Self-pay | Admitting: Pharmacist

## 2024-09-12 ENCOUNTER — Other Ambulatory Visit: Payer: Self-pay

## 2024-09-12 NOTE — Telephone Encounter (Signed)
 Called patient to schedule an appointment for the [facility]. I was unable to reach the patient so I left a HIPAA-compliant message requesting that the patient return my call.   Christopher Terrell, PharmD, JAQUELINE, CPP Clinical Pharmacist [facility] [phone]

## 2024-09-12 NOTE — Telephone Encounter (Signed)
 Pt stated that he will call after the new year to schedule the lab appt for the additional labs.

## 2024-09-15 ENCOUNTER — Other Ambulatory Visit: Payer: Self-pay

## 2024-09-15 NOTE — Progress Notes (Signed)
 Specialty Pharmacy Refill Coordination Note  Christopher Terrell is a 38 y.o. male contacted today regarding refills of specialty medication(s) Dupilumab  (Dupixent )   Patient requested Delivery   Delivery date: 09/28/24   Verified address: [address]  [place]   Medication will be filled on: 09/27/24

## 2024-09-18 ENCOUNTER — Encounter: Admitting: Internal Medicine

## 2024-09-19 ENCOUNTER — Telehealth: Admitting: Physician Assistant

## 2024-09-19 ENCOUNTER — Encounter: Payer: Self-pay | Admitting: Internal Medicine

## 2024-09-19 ENCOUNTER — Other Ambulatory Visit: Payer: Self-pay

## 2024-09-19 DIAGNOSIS — M545 Low back pain, unspecified: Secondary | ICD-10-CM

## 2024-09-19 MED ORDER — BACLOFEN 10 MG PO TABS
10.0000 mg | ORAL_TABLET | Freq: Three times a day (TID) | ORAL | 0 refills | Status: DC
  Filled 2024-09-19: qty 1, 1d supply, fill #0
  Filled 2024-09-19: qty 29, 9d supply, fill #0

## 2024-09-19 MED ORDER — NAPROXEN 500 MG PO TABS
500.0000 mg | ORAL_TABLET | Freq: Two times a day (BID) | ORAL | 0 refills | Status: DC
  Filled 2024-09-19: qty 30, 15d supply, fill #0

## 2024-09-19 NOTE — Addendum Note (Signed)
 Addended by: ALMEDA DEGREE on: 09/19/2024 10:44 AM   Modules accepted: Orders

## 2024-09-19 NOTE — Progress Notes (Signed)
 We are sorry that you are not feeling well.  Here is how we plan to help!  Based on what you have shared with me it, appears you may be experiencing acute back pain.   Acute back pain is defined as musculoskeletal pain that can resolve in 1-3 weeks with conservative treatment.  I have prescribed a non-steroid anti-inflammatory (NSAID) Naprosyn 500 mg -- take one by mouth twice a day, as well as a muscle relaxant, Baclofen  10 mg every eight hours as needed. Some patients experience stomach irritation or in increased heartburn with anti-inflammatory drugs.  Please keep in mind that muscle relaxer's can cause fatigue and should not be taken while at work or driving.  Back pain is very common.  The pain often gets better over time.  The cause of back pain is usually not dangerous.  Most people can learn to manage their back pain on their own.  Home Care Stay active.  Start with short walks on flat ground if you can.  Try to walk farther each day. Do not sit, drive or stand in one place for more than 30 minutes.  Do not stay in bed. Do not fully avoid exercise or work.  Activity can help your back heal faster. Be careful when you bend or lift an object.  Bend at your knees, keep the object close to you, and do not twist. Sleep on a firm mattress.  Lie on your side, and bend your knees.  If you lie on your back, put a pillow under your knees. Only take medicines as told by your doctor. Put ice on the injured area. Put ice in a plastic bag Place a towel between your skin and the bag Leave the ice on for 15-20 minutes, 3-4 times a day for the first 2-3 days.  After that, you can switch between ice and heat packs. Ask your doctor about back exercises or massage.   Get Help Right Way If: Your pain does not go away with rest and treatments given today. Your pain does not go away within 1 week. You have new problems. You do not feel well. The pain spreads into your legs. You cannot control when you  poop (bowel movement) or pee (urinate). You feel sick to your stomach (nauseous) or throw up (vomit). You have belly (abdominal) pain. You feel like you may pass out (faint). If you develop a fever.  Make Sure you: Understand these instructions. Continue to monitor your condition for any changes. Will get help right away if you are not doing well or get worse.  Your e-visit answers were reviewed by a board certified advanced clinical practitioner to complete your personal care plan.  Depending on the condition, your plan could have included both over the counter or prescription medications.  If there is a problem, please reply once you have received a response from your provider.  Your safety is important to us .  If you have drug allergies, check your prescription carefully.    You can use MyChart to ask questions about todays visit, request a non-urgent call back, or ask for a work or school excuse for 24 hours related to this e-Visit. If it has been greater than 24 hours you will need to follow up with your provider or enter a new e-Visit to address those concerns.  You will get an e-mail in the next two days asking about your experience.  I hope that your e-visit has been valuable and will speed your  recovery. Thank you for using e-visits.   I have spent 5 minutes in review of e-visit questionnaire, review and updating patient chart, medical decision making and response to patient.   Belem Hintze, FNP

## 2024-09-20 ENCOUNTER — Telehealth: Payer: Self-pay

## 2024-09-20 NOTE — Telephone Encounter (Signed)
 Copied from CRM [id number]. Topic: Clinical - Request for Lab/Test Order >> Sep 20, 2024 11:35 AM Eva FALCON wrote: Reason for CRM: Pt is requesting an xray, says he just found out that his dad side of the family has history of Leona genitive disk disease and he wants to get checked sooner rather than later. Please call [phone].

## 2024-09-20 NOTE — Telephone Encounter (Signed)
 Is it okay to order the xray or does pt need to be seen to discuss?

## 2024-09-22 NOTE — Telephone Encounter (Signed)
 Pt is aware.

## 2024-09-22 NOTE — Addendum Note (Signed)
 Addended by: Agastya Meister, DEBBIE C on: 09/22/2024 03:52 PM   Modules accepted: Orders

## 2024-09-26 ENCOUNTER — Other Ambulatory Visit (INDEPENDENT_AMBULATORY_CARE_PROVIDER_SITE_OTHER)

## 2024-09-26 ENCOUNTER — Other Ambulatory Visit: Payer: Self-pay

## 2024-09-26 DIAGNOSIS — E538 Deficiency of other specified B group vitamins: Secondary | ICD-10-CM

## 2024-09-26 MED ORDER — AZITHROMYCIN 250 MG PO TABS
ORAL_TABLET | ORAL | 0 refills | Status: AC
  Filled 2024-09-26: qty 6, 5d supply, fill #0

## 2024-09-26 NOTE — Addendum Note (Signed)
 Addended by: BRIEN SHARENE RAMAN on: 09/26/2024 10:05 AM   Modules accepted: Orders

## 2024-09-27 ENCOUNTER — Other Ambulatory Visit: Payer: Self-pay

## 2024-09-28 ENCOUNTER — Other Ambulatory Visit (HOSPITAL_COMMUNITY): Payer: Self-pay

## 2024-09-28 ENCOUNTER — Other Ambulatory Visit: Payer: Self-pay

## 2024-09-28 ENCOUNTER — Ambulatory Visit: Attending: Internal Medicine | Admitting: Pharmacist

## 2024-09-28 DIAGNOSIS — Z79899 Other long term (current) drug therapy: Secondary | ICD-10-CM

## 2024-09-28 MED ORDER — DUPIXENT 300 MG/2ML ~~LOC~~ SOSY: PREFILLED_SYRINGE | SUBCUTANEOUS | 11 refills | Status: DC

## 2024-09-28 MED ORDER — DUPIXENT 300 MG/2ML ~~LOC~~ SOSY
PREFILLED_SYRINGE | SUBCUTANEOUS | 11 refills | Status: AC
  Filled 2024-09-28 – 2024-10-03 (×2): qty 4, 28d supply, fill #0
  Filled 2024-10-26: qty 4, 28d supply, fill #1

## 2024-09-28 NOTE — Progress Notes (Signed)
" ° °  S: Patient presents for review of their specialty medication therapy.  Patient is currently taking Dupixent  for atopic dermatitis. Patient is managed by Dr. Dela for this.   Adherence: denies any missed doses  Efficacy: reports that it continues to work well for him and denies any adverse effects  Dosing: 300 mg every 2 weeks  Monitoring: S/sx of infection: denies Hypersensitivity reaction: denies Ocular effects: denies S/sx of eosinophilia/vasculitis: denies  O:      Lab Results  Component Value Date   WBC 5.2 09/04/2024   HGB 10.9 (L) 09/04/2024   HCT 32.5 (L) 09/04/2024   MCV 88.4 09/04/2024   PLT 494.0 (H) 09/04/2024      Chemistry      Component Value Date/Time   NA 138 09/04/2024 1148   NA 140 11/19/2023 1508   NA 138 04/27/2014 0741   K 3.9 09/04/2024 1148   K 4.1 04/27/2014 0741   CL 104 09/04/2024 1148   CL 104 04/27/2014 0741   CO2 26 09/04/2024 1148   CO2 26 04/27/2014 0741   BUN 13 09/04/2024 1148   BUN 10 11/19/2023 1508   BUN 19 (H) 04/27/2014 0741   CREATININE 0.92 09/04/2024 1148   CREATININE 1.12 04/27/2014 0741   GLU 102 04/27/2014 0741      Component Value Date/Time   CALCIUM 9.9 09/04/2024 1148   CALCIUM 8.7 04/27/2014 0741   ALKPHOS 61 09/04/2024 1148   ALKPHOS 78 04/27/2014 0741   AST 19 09/04/2024 1148   AST 25 04/27/2014 0741   ALT 19 09/04/2024 1148   ALT 40 04/27/2014 0741   BILITOT 0.4 09/04/2024 1148   BILITOT 0.3 11/19/2023 1508   BILITOT 0.6 04/27/2014 0741       A/P: 1. Medication review: Patient currently on Dupixent  for atopic dermatitis and is tolerating it well with no adverse effects and good control of his atopic dermatitis. Reviewed medication with patient, including the following: Dupixent  is a monoclonal antibody used for atopic dermatitis and asthma. Possible adverse effects include increased risk of infection, ocular effects, injection site reactions, and eosinophilia/vasculitis. No recommendations for  any changes.  Herlene Fleeta Morris, PharmD, JAQUELINE, CPP Clinical Pharmacist [facility] [phone]        "

## 2024-09-28 NOTE — Progress Notes (Signed)
 Copay with copay card is $50. Per Kim-we cannot offset copay until patient calls manufacturer and requests maximizer flag be removed.   Manufacturer copay card is applying, but limited due to an incorrect copay maximizer flag on the manufacturer side.  Luke is actively working with [employer] to review plan configuration and determine if any corrections can be made on insurance side. At this time, the patient will need to contact the manufacturer copay card program and tell them to remove flag so they will have access to full funding.   Patient advised via MyChart to contact the manufacturer copay card progam directly, provide insurance details and request flag be removed so full benefits can be applied. Instructed patient to call pharmacy once issue resolved.

## 2024-09-29 ENCOUNTER — Ambulatory Visit: Payer: Self-pay | Admitting: Internal Medicine

## 2024-09-29 ENCOUNTER — Other Ambulatory Visit (HOSPITAL_COMMUNITY): Payer: Self-pay

## 2024-09-29 ENCOUNTER — Other Ambulatory Visit: Payer: Self-pay

## 2024-09-29 LAB — VITAMIN B1: Thiamine: 111.9 nmol/L (ref 66.5–200.0)

## 2024-09-29 LAB — INTRINSIC FACTOR ANTIBODIES: Intrinsic Factor Abs, Serum: 1.1 [AU]/ml (ref 0.0–1.1)

## 2024-10-03 ENCOUNTER — Other Ambulatory Visit: Payer: Self-pay

## 2024-10-03 ENCOUNTER — Other Ambulatory Visit (HOSPITAL_COMMUNITY): Payer: Self-pay

## 2024-10-03 MED ORDER — CYANOCOBALAMIN 1000 MCG/ML IJ SOLN
1000.0000 ug | INTRAMUSCULAR | 3 refills | Status: AC
  Filled 2024-10-03 (×2): qty 3, 90d supply, fill #0

## 2024-10-04 ENCOUNTER — Other Ambulatory Visit (HOSPITAL_COMMUNITY): Payer: Self-pay

## 2024-10-04 ENCOUNTER — Other Ambulatory Visit: Payer: Self-pay | Admitting: Internal Medicine

## 2024-10-04 ENCOUNTER — Other Ambulatory Visit: Payer: Self-pay

## 2024-10-04 ENCOUNTER — Other Ambulatory Visit: Payer: Self-pay | Admitting: Pharmacist

## 2024-10-04 NOTE — Progress Notes (Signed)
 Ship 10/05/24 Patient Spoke with [employer]  and they are supposed to be fixing issue. Ok to [employer] zero.

## 2024-10-04 NOTE — Progress Notes (Signed)
 Opened in error

## 2024-10-05 ENCOUNTER — Other Ambulatory Visit (HOSPITAL_COMMUNITY): Payer: Self-pay

## 2024-10-05 ENCOUNTER — Other Ambulatory Visit: Payer: Self-pay

## 2024-10-06 ENCOUNTER — Other Ambulatory Visit: Payer: Self-pay

## 2024-10-06 MED ORDER — AMPHETAMINE-DEXTROAMPHETAMINE 7.5 MG PO TABS
7.5000 mg | ORAL_TABLET | Freq: Every day | ORAL | 0 refills | Status: AC
  Filled 2024-10-06 – 2024-10-20 (×2): qty 30, 30d supply, fill #0

## 2024-10-06 NOTE — Telephone Encounter (Signed)
 09/04/2024 lov  09/04/2024 fill date  30/0 refills

## 2024-10-09 NOTE — Progress Notes (Unsigned)
 "   10/10/2024 Christopher Terrell [id number] 1985/11/02  Gastroenterology Office Note    Referring Provider: Kandis Devaughn Sayres, MD Primary Care Physician:  Marylynn Verneita CROME, MD  Primary GI Provider: Celestia Rima, NP; Jinny Carmine, MD    Chief Complaint   Chief Complaint  Patient presents with   New Patient (Initial Visit)    GI bleed- black stools-positive fecal occult blood test- much better now-      History of Present Illness   Christopher Terrell is a 39 y.o. male with PMHX of HTN, alcohol use, remote history of GI bleed from a hiatal hernia status post hiatal hernia repair 20 years ago, presenting today at the request of Wouk, Devaughn Sayres, MD due to GI bleed and symptomatic anemia.  Discussed the use of AI scribe software for clinical note transcription with the patient, who gave verbal consent to proceed.  He experienced a gastrointestinal bleed secondary to a gastric ulcer, which was visualized on upper endoscopy.  He reports having 7 days of black stools and significant anemia developed, with a low hemoglobin resulting in shortness of breath and requiring hospitalization. Iron  levels were also low at that time.   Since discharge, there have been no further episodes of gastrointestinal bleeding, melena, or hematochezia. Bowel movements have returned to normal. He denies current abdominal pain or changes in stool.  He denies use of ibuprofen or other NSAIDs. Alcohol use was discontinued prior to the gastrointestinal bleed and has not resumed since before Thanksgiving. He drinks coffee but rarely consumes carbonated beverages.   Patient hospitalized for anemia on 08/22/2024. Hgb 4.9.  7 day history of dark tarry stools and progressive dyspnea.   08/22/2024 EGD - Normal esophagus.  - Non-bleeding gastric ulcer with no stigmata of bleeding.  - Normal examined duodenum.  - No specimens collected. - Perform colonoscopy as outpt.  Past Medical History:  Diagnosis Date    Hypertension     Past Surgical History:  Procedure Laterality Date   ESOPHAGOGASTRODUODENOSCOPY N/A 08/22/2024   Procedure: EGD (ESOPHAGOGASTRODUODENOSCOPY);  Surgeon: Jinny Carmine, MD;  Location: [facility] ENDOSCOPY;  Service: Endoscopy;  Laterality: N/A;   HERNIA REPAIR     SMALL INTESTINE SURGERY      Current Outpatient Medications  Medication Sig Dispense Refill   amphetamine -dextroamphetamine  (ADDERALL  XR) 10 MG 24 hr capsule Take 1 capsule (10 mg total) by mouth daily. 30 capsule 0   amphetamine -dextroamphetamine  (ADDERALL ) 7.5 MG tablet Take 1 tablet by mouth daily in the afternoon. 30 tablet 0   buPROPion  (WELLBUTRIN  XL) 300 MG 24 hr tablet Take 1 tablet (300 mg total) by mouth daily. 30 tablet 1   clonazePAM  (KLONOPIN ) 0.25 MG disintegrating tablet Take 1 tablet (0.25 mg total) by mouth daily as needed. 30 tablet 0   cyanocobalamin  (VITAMIN B12) 1000 MCG/ML injection Inject 1 mL (1,000 mcg total) into the muscle every 30 (thirty) days. 3 mL 3   dupilumab  (DUPIXENT ) 300 MG/2ML prefilled syringe Inject 300 mg syringe SQ every other week 4 mL 11   EPINEPHrine  0.3 mg/0.3 mL IJ SOAJ injection Inject 0.3 mg into the muscle as directed. 2 each 2   [Paused] metoprolol  succinate (TOPROL -XL) 50 MG 24 hr tablet Take 1 tablet (50 mg total) by mouth daily. Take with or immediately following a meal. 90 tablet 1   Sodium Fluoride  1.1 % PSTE Brush with pea sized amount for 2 minutes twice a day. Spit, do not swallow. Do not eat, drink or rinse mouth for 30  mins 100 mL 3   Syringe/Needle, Disp, (SYRINGE 3CC/25GX1) 25G X 1 3 ML MISC Use for b12 injections 50 each 0   traZODone  (DESYREL ) 50 MG tablet Take 1 tablet (50 mg total) by mouth at bedtime. 90 tablet 1   No current facility-administered medications for this visit.    Allergies as of 10/10/2024   (No Known Allergies)    Family History  Problem Relation Age of Onset   Cancer Mother 57       lung ca   Arthritis Mother    Lung cancer  Mother    Heart disease Maternal Grandmother    Heart disease Maternal Grandfather    Cancer Paternal Grandfather 38       lymphoma    Social History   Socioeconomic History   Marital status: Single    Spouse name: Not on file   Number of children: Not on file   Years of education: Not on file   Highest education level: Not on file  Occupational History   Not on file  Tobacco Use   Smoking status: Former    Current packs/day: 0.00    Types: Cigarettes    Quit date: 09/22/2011    Years since quitting: 13.0   Smokeless tobacco: Never  Vaping Use   Vaping status: Never Used  Substance and Sexual Activity   Alcohol use: Yes    Alcohol/week: 7.0 standard drinks of alcohol    Types: 7 Cans of beer per week   Drug use: No   Sexual activity: Not on file  Other Topics Concern   Not on file  Social History Narrative   Not on file   Social Drivers of Health   Tobacco Use: Medium Risk (10/10/2024)   Patient History    Smoking Tobacco Use: Former    Smokeless Tobacco Use: Never    Passive Exposure: Not on Actuary Strain: Not on file  Food Insecurity: No Food Insecurity (08/22/2024)   Epic    Worried About Programme Researcher, Broadcasting/film/video in the Last Year: Never true    Ran Out of Food in the Last Year: Never true  Transportation Needs: No Transportation Needs (08/22/2024)   Epic    Lack of Transportation (Medical): No    Lack of Transportation (Non-Medical): No  Physical Activity: Not on file  Stress: Not on file  Social Connections: Not on file  Intimate Partner Violence: Not At Risk (08/22/2024)   Epic    Fear of Current or Ex-Partner: No    Emotionally Abused: No    Physically Abused: No    Sexually Abused: No  Depression (PHQ2-9): Low Risk (09/04/2024)   Depression (PHQ2-9)    PHQ-2 Score: 0  Alcohol Screen: Not on file  Housing: Low Risk (08/22/2024)   Epic    Unable to Pay for Housing in the Last Year: No    Number of Times Moved in the Last Year: 0     Homeless in the Last Year: No  Utilities: Not At Risk (08/22/2024)   Epic    Threatened with loss of utilities: No  Health Literacy: Not on file     RELEVANT GI HISTORY, IMAGING AND LABS: CBC    Component Value Date/Time   WBC 5.2 09/04/2024 1148   RBC 3.68 (L) 09/04/2024 1148   HGB 10.9 (L) 09/04/2024 1148   HGB 14.3 11/19/2023 1508   HCT 32.5 (L) 09/04/2024 1148   HCT 41.5 11/19/2023 1508   PLT 494.0 (  H) 09/04/2024 1148   PLT 282 11/19/2023 1508   MCV 88.4 09/04/2024 1148   MCV 91 11/19/2023 1508   MCH 31.3 08/23/2024 0441   MCHC 33.6 09/04/2024 1148   RDW 14.7 09/04/2024 1148   RDW 12.6 11/19/2023 1508   LYMPHSABS 1.7 09/04/2024 1148   LYMPHSABS 2.0 11/19/2023 1508   MONOABS 0.5 09/04/2024 1148   EOSABS 0.1 09/04/2024 1148   EOSABS 0.1 11/19/2023 1508   BASOSABS 0.0 09/04/2024 1148   BASOSABS 0.1 11/19/2023 1508   Recent Labs    11/19/23 1508 08/21/24 1008 08/22/24 0508 08/22/24 1529 08/22/24 2000 08/23/24 0441 09/04/24 1148  HGB 14.3 5.2* 4.9* 7.4* 7.7* 7.1* 10.9*    CMP     Component Value Date/Time   NA 138 09/04/2024 1148   NA 140 11/19/2023 1508   NA 138 04/27/2014 0741   K 3.9 09/04/2024 1148   K 4.1 04/27/2014 0741   CL 104 09/04/2024 1148   CL 104 04/27/2014 0741   CO2 26 09/04/2024 1148   CO2 26 04/27/2014 0741   GLUCOSE 84 09/04/2024 1148   GLUCOSE 102 (H) 04/27/2014 0741   BUN 13 09/04/2024 1148   BUN 10 11/19/2023 1508   BUN 19 (H) 04/27/2014 0741   CREATININE 0.92 09/04/2024 1148   CREATININE 1.12 04/27/2014 0741   CALCIUM 9.9 09/04/2024 1148   CALCIUM 8.7 04/27/2014 0741   PROT 7.3 09/04/2024 1148   PROT 7.1 11/19/2023 1508   PROT 7.1 04/27/2014 0741   ALBUMIN 4.5 09/04/2024 1148   ALBUMIN 4.6 11/19/2023 1508   ALBUMIN 3.6 04/27/2014 0741   AST 19 09/04/2024 1148   AST 25 04/27/2014 0741   ALT 19 09/04/2024 1148   ALT 40 04/27/2014 0741   ALKPHOS 61 09/04/2024 1148   ALKPHOS 78 04/27/2014 0741   BILITOT 0.4 09/04/2024 1148    BILITOT 0.3 11/19/2023 1508   BILITOT 0.6 04/27/2014 0741   GFRNONAA >60 08/23/2024 0441   GFRNONAA >60 04/27/2014 0741   GFRAA >60 04/27/2014 0741      Latest Ref Rng & Units 09/04/2024   11:48 AM 11/19/2023    3:08 PM 06/15/2023    5:15 PM  Hepatic Function  Total Protein 6.0 - 8.3 g/dL 7.3  7.1  7.3   Albumin 3.5 - 5.2 g/dL 4.5  4.6  4.0   AST 0 - 37 U/L 19  96  42   ALT 0 - 53 U/L 19  96  82   Alk Phosphatase 39 - 117 U/L 61  79  58   Total Bilirubin 0.2 - 1.2 mg/dL 0.4  0.3  0.6   Bilirubin, Direct 0.00 - 0.40 mg/dL  9.87  <9.8       Review of Systems   All systems reviewed and negative except where noted in HPI.    Physical Exam  BP 121/83   Pulse 98   Temp 98 F (36.7 C)   Ht 5' 7 (1.702 m)   Wt 221 lb 3.2 oz (100.3 kg)   SpO2 98%   BMI 34.64 kg/m  No LMP for male patient. General:   Alert and oriented. Pleasant and cooperative. Well-nourished and well-developed. In no acute distress.  Head:  Normocephalic and atraumatic. Eyes:  Without icterus Ears:  Normal auditory acuity. Lungs:  Respirations even and unlabored.  Clear throughout to auscultation.   No wheezes, crackles, or rhonchi. No acute distress. Heart:  Regular rate and rhythm; no murmurs, clicks, rubs, or gallops. Abdomen:  Normal bowel sounds.  No bruits.  Soft, non-tender and non-distended without masses, hepatosplenomegaly or hernias noted.  No guarding or rebound tenderness.  Rectal:  Deferred. Msk:  Symmetrical without gross deformities. Normal posture. Extremities:  Without edema. Neurologic:  Alert and  oriented x4;  grossly normal neurologically. Skin:  Intact without significant lesions or rashes. Psych:  Alert and cooperative. Normal mood and affect.   Assessment & Plan   Sachit Gilman Hagarty is a 39 y.o. male presenting today following GI bleed with symptomatic anemia. Hgb found to be 4.9 on admission and 10.9 on 09/04/2024.   Patient denies further episodes of black stools or other GI  issues.  Recommendation for colonoscopy as outpatient due to profound anemia.  Will also plan for repeat upper endoscopy to assess for healing of gastric ulcer.  I have ordered repeat CBC and iron  panel.  He will continue on pantoprazole  40 mg twice daily.  Patient will also continue taking oral iron  supplementation daily.   The risks, benefits, and alternatives of endoscopies have been discussed with the patient in detail, which include, but are not limited to: bleeding, infection, perforation & drug reaction. The patient states understanding and desires to proceed.  Follow-up in 3 months  Grayce Bohr, DNP, AGNP-C [facility]astroenter[facility]  "

## 2024-10-10 ENCOUNTER — Encounter: Payer: Self-pay | Admitting: Family Medicine

## 2024-10-10 ENCOUNTER — Ambulatory Visit: Admitting: Family Medicine

## 2024-10-10 VITALS — BP 121/83 | HR 98 | Temp 98.0°F | Ht 67.0 in | Wt 221.2 lb

## 2024-10-10 DIAGNOSIS — D649 Anemia, unspecified: Secondary | ICD-10-CM | POA: Diagnosis not present

## 2024-10-10 DIAGNOSIS — K254 Chronic or unspecified gastric ulcer with hemorrhage: Secondary | ICD-10-CM | POA: Diagnosis not present

## 2024-10-11 ENCOUNTER — Other Ambulatory Visit: Payer: Self-pay

## 2024-10-11 ENCOUNTER — Telehealth: Payer: Self-pay

## 2024-10-11 DIAGNOSIS — K254 Chronic or unspecified gastric ulcer with hemorrhage: Secondary | ICD-10-CM

## 2024-10-11 MED ORDER — NA SULFATE-K SULFATE-MG SULF 17.5-3.13-1.6 GM/177ML PO SOLN
1.0000 | Freq: Once | ORAL | 0 refills | Status: AC
  Filled 2024-10-11: qty 354, 1d supply, fill #0

## 2024-10-11 NOTE — Telephone Encounter (Signed)
 error

## 2024-10-11 NOTE — Telephone Encounter (Signed)
 Spoke with O[facility]  Endo to move patient from 11-09-24 w/Dr.Wohl to 02-08-25 w/ Dr.Wohl- patient aware. Mailed new change instructions to patient.

## 2024-10-12 ENCOUNTER — Telehealth: Payer: Self-pay

## 2024-10-12 ENCOUNTER — Other Ambulatory Visit: Payer: Self-pay

## 2024-10-12 DIAGNOSIS — D649 Anemia, unspecified: Secondary | ICD-10-CM

## 2024-10-12 DIAGNOSIS — K254 Chronic or unspecified gastric ulcer with hemorrhage: Secondary | ICD-10-CM

## 2024-10-12 NOTE — Telephone Encounter (Signed)
 Rescheduled Colonoscopy 02-15-25 w/ Dr.Wohl [facility]-patient aware.

## 2024-10-12 NOTE — Telephone Encounter (Signed)
 PT stares he is unaware of the dated change and he was told that 02-08-2025 procedure has been canceled. He wasn't to have procedure put back on schedule.

## 2024-10-16 ENCOUNTER — Other Ambulatory Visit: Payer: Self-pay

## 2024-10-19 ENCOUNTER — Telehealth: Payer: Self-pay

## 2024-10-19 ENCOUNTER — Encounter: Payer: Self-pay | Admitting: Family Medicine

## 2024-10-19 NOTE — Telephone Encounter (Signed)
 Per pt phone call he would like to switch his dx to routine so that insurance will pay. I explained to him the codes we had, pt ask to speak to CMA/RN  to see if he could explain why he wanted it changed.

## 2024-10-19 NOTE — Telephone Encounter (Signed)
 LVM for patient to return call- codes cannot be changed-

## 2024-10-19 NOTE — Telephone Encounter (Signed)
 I spoke with patient and let him know the codes could not be changed to screening because he had history of GI bleed and peptic ulcer- he told me he did  not believe me and that he would  figure it out and hung up.   Per pt phone call he would like to switch his dx to routine so that insurance will pay. I explained to him the codes we had, pt ask to speak to CMA/RN to see if he could explain why he wanted it changed.

## 2024-10-20 ENCOUNTER — Other Ambulatory Visit: Payer: Self-pay

## 2024-10-25 ENCOUNTER — Encounter: Payer: Self-pay | Admitting: Internal Medicine

## 2024-10-26 ENCOUNTER — Other Ambulatory Visit: Payer: Self-pay

## 2024-10-26 MED ORDER — BUPROPION HCL ER (XL) 300 MG PO TB24
300.0000 mg | ORAL_TABLET | Freq: Every day | ORAL | 1 refills | Status: AC
  Filled 2024-10-26: qty 90, 90d supply, fill #0

## 2024-10-27 ENCOUNTER — Other Ambulatory Visit: Payer: Self-pay

## 2024-10-27 NOTE — Progress Notes (Signed)
 Specialty Pharmacy Refill Coordination Note  Christopher Terrell is a 39 y.o. male contacted today regarding refills of specialty medication(s) Dupilumab  (Dupixent )   Patient requested Delivery   Delivery date: 10/31/24   Verified address: [address] [place], [place] [address]   Medication will be filled on: 10/30/24

## 2025-02-07 ENCOUNTER — Ambulatory Visit: Admitting: Family Medicine

## 2025-02-08 ENCOUNTER — Ambulatory Visit: Admit: 2025-02-08 | Admitting: Gastroenterology

## 2025-02-08 SURGERY — COLONOSCOPY
Anesthesia: General

## 2025-02-15 ENCOUNTER — Ambulatory Visit: Admit: 2025-02-15 | Admitting: Gastroenterology

## 2025-02-15 SURGERY — COLONOSCOPY
Anesthesia: General

## 2025-03-07 ENCOUNTER — Ambulatory Visit: Admitting: Internal Medicine
